# Patient Record
Sex: Male | Born: 1960 | Race: White | Hispanic: No | Marital: Married | Smoking: Former smoker
Health system: Southern US, Community
[De-identification: ages and names within clinical notes are randomized; demographics above are authoritative.]

## PROBLEM LIST (undated history)

## (undated) DIAGNOSIS — G4730 Sleep apnea, unspecified: Secondary | ICD-10-CM

## (undated) DIAGNOSIS — E785 Hyperlipidemia, unspecified: Secondary | ICD-10-CM

## (undated) DIAGNOSIS — I10 Essential (primary) hypertension: Secondary | ICD-10-CM

## (undated) DIAGNOSIS — J45909 Unspecified asthma, uncomplicated: Secondary | ICD-10-CM

## (undated) DIAGNOSIS — F32A Depression, unspecified: Secondary | ICD-10-CM

## (undated) HISTORY — DX: Sleep apnea, unspecified: G47.30

## (undated) HISTORY — PX: HIP FRACTURE SURGERY: SHX118

## (undated) HISTORY — PX: KIDNEY STONE SURGERY: SHX686

## (undated) HISTORY — PX: HIP SURGERY: SHX245

## (undated) HISTORY — DX: Unspecified asthma, uncomplicated: J45.909

## (undated) HISTORY — PX: FOOT SURGERY: SHX648

## (undated) HISTORY — DX: Hyperlipidemia, unspecified: E78.5

## (undated) HISTORY — PX: SHOULDER SURGERY: SHX246

## (undated) HISTORY — PX: APPENDECTOMY: SHX54

---

## 2008-08-21 ENCOUNTER — Ambulatory Visit (HOSPITAL_COMMUNITY): Admission: RE | Admit: 2008-08-21 | Discharge: 2008-08-21 | Payer: Self-pay | Admitting: Cardiology

## 2010-01-12 ENCOUNTER — Encounter
Admission: RE | Admit: 2010-01-12 | Discharge: 2010-01-18 | Payer: Self-pay | Source: Home / Self Care | Admitting: Otolaryngology

## 2014-02-14 ENCOUNTER — Encounter (HOSPITAL_COMMUNITY): Payer: Self-pay | Admitting: *Deleted

## 2014-02-14 ENCOUNTER — Emergency Department (HOSPITAL_COMMUNITY)
Admission: EM | Admit: 2014-02-14 | Discharge: 2014-02-14 | Disposition: A | Discharge status: Home / Self Care | Payer: TRICARE For Life (TFL) | Attending: Emergency Medicine | Admitting: Emergency Medicine

## 2014-02-14 DIAGNOSIS — J029 Acute pharyngitis, unspecified: Secondary | ICD-10-CM | POA: Insufficient documentation

## 2014-02-14 DIAGNOSIS — Z87891 Personal history of nicotine dependence: Secondary | ICD-10-CM | POA: Insufficient documentation

## 2014-02-14 DIAGNOSIS — Z79899 Other long term (current) drug therapy: Secondary | ICD-10-CM | POA: Diagnosis not present

## 2014-02-14 DIAGNOSIS — I10 Essential (primary) hypertension: Secondary | ICD-10-CM | POA: Diagnosis not present

## 2014-02-14 HISTORY — DX: Essential (primary) hypertension: I10

## 2014-02-14 LAB — RAPID STREP SCREEN (MED CTR MEBANE ONLY): Streptococcus, Group A Screen (Direct): NEGATIVE

## 2014-02-14 MED ORDER — DEXAMETHASONE SODIUM PHOSPHATE 10 MG/ML IJ SOLN
10.0000 mg | Freq: Once | INTRAMUSCULAR | Status: AC
  Administered 2014-02-14: 10 mg via INTRAMUSCULAR
  Filled 2014-02-14: qty 1

## 2014-02-14 NOTE — ED Provider Notes (Signed)
CSN: [id number]     Arrival date & time 02/14/14  4235 History  This chart was scribed for non-physician practitioner working with Orpah Greek, by Molli Posey, ED Scribe. This patient was seen in room TR07C/TR07C and the patient's care was started at 10:51 AM.    Chief Complaint  Patient presents with  . Sore Throat  . Fever   The history is provided by the patient. No language interpreter was used.   HPI Comments: Joel Mullins is a 53 y.o. male with a history of HTN who presents to the Emergency Department complaining of a sore throat for the last week. Pt states he is now having a hard time swallowing and that his throat feels more swollen on the right side. He reports he has had an associated fever with a maximum temperature of 102.0. He states he has tried chloraseptic and salt water rinses which have failed to provide relief. Pt reports no known sick contacts. Pt reports no modifying factors at this time.    Past Medical History  Diagnosis Date  . Hypertension    History reviewed. No pertinent past surgical history. No family history on file. History  Substance Use Topics  . Smoking status: Former Research scientist (life sciences)  . Smokeless tobacco: Not on file  . Alcohol Use: No    Review of Systems  Constitutional: Positive for fever.  HENT: Positive for sore throat and trouble swallowing.   All other systems reviewed and are negative.   Allergies  Iohexol  Home Medications   Prior to Admission medications   Medication Sig Start Date End Date Taking? Authorizing Provider  amLODipine (NORVASC) 2.5 MG tablet Take 2.5 mg by mouth daily.   Yes Historical Provider, MD  carvedilol (COREG) 25 MG tablet Take 25 mg by mouth 2 (two) times daily with a meal.   Yes Historical Provider, MD  traMADol (ULTRAM) 50 MG tablet Take 50 mg by mouth every 6 (six) hours as needed (for pain).   Yes Historical Provider, MD   BP 148/114 mmHg  Pulse 77  Temp(Src) 97.9 F (36.6 C) (Oral)  Resp  18  SpO2 100% Physical Exam  Constitutional: He is oriented to person, place, and time. He appears well-developed and well-nourished. No distress.  HENT:  Head: Normocephalic and atraumatic.  Right Ear: External ear normal.  Left Ear: External ear normal.  Mouth/Throat: Posterior oropharyngeal edema and posterior oropharyngeal erythema present. No tonsillar abscesses.  Eyes: Right eye exhibits no discharge. Left eye exhibits no discharge.  Neck: Normal range of motion. Neck supple. No tracheal deviation present.  Cardiovascular: Normal rate.   Pulmonary/Chest: Effort normal. No respiratory distress.  Abdominal: He exhibits no distension.  Neurological: He is alert and oriented to person, place, and time.  Skin: Skin is warm and dry.  Psychiatric: He has a normal mood and affect. His behavior is normal.  Nursing note and vitals reviewed.   ED Course  Procedures  DIAGNOSTIC STUDIES: Oxygen Saturation is 100% on RA, normal by my interpretation.    COORDINATION OF CARE: 10:53 AM Discussed treatment plan with pt at bedside and pt agreed to plan.   Labs Review Labs Reviewed  RAPID STREP SCREEN    Imaging Review No results found.   EKG Interpretation None      MDM   Final diagnoses:  Pharyngitis   Strep is negative here. Pt treated with decadron for comfort. Discussed return and follow up precautions  I personally performed the services described in this documentation,  which was scribed in my presence. The recorded information has been reviewed and is accurate.     Glendell Docker, NP 02/14/14 [place], MD 02/14/14 1314

## 2014-02-14 NOTE — Discharge Instructions (Signed)

## 2014-02-14 NOTE — ED Notes (Signed)
Pt is here with sorethroat for one week and now sore and hard to swallow.  Pt states feels more swollen on right side.  Throat swollen, wakes up coughing and choking on saliva.  Reports temp 102.

## 2014-02-17 LAB — CULTURE, GROUP A STREP

## 2014-02-18 ENCOUNTER — Telehealth: Payer: Self-pay | Admitting: Emergency Medicine

## 2014-02-18 NOTE — Progress Notes (Signed)
ED Antimicrobial Stewardship Positive Culture Follow Up   Denis Carreon is an 53 y.o. male who presented to [facility] on 02/14/2014 with a chief complaint of  Chief Complaint  Patient presents with  . Sore Throat  . Fever    Recent Results (from the past 720 hour(s))  Rapid strep screen   (If patient has fever and/or without cough or runny nose)     Status: None   Collection Time: 02/14/14 10:26 AM  Result Value Ref Range Status   Streptococcus, Group A Screen (Direct) NEGATIVE NEGATIVE Final    Comment: (NOTE) A Rapid Antigen test may result negative if the antigen level in the sample is below the detection level of this test. The FDA has not cleared this test as a stand-alone test therefore the rapid antigen negative result has reflexed to a Group A Strep culture.   Culture, Group A Strep     Status: None   Collection Time: 02/14/14 10:26 AM  Result Value Ref Range Status   Specimen Description THROAT  Final   Special Requests NONE  Final   Culture   Final    GROUP A STREP (S.PYOGENES) ISOLATED Performed at [facility]    Report Status 02/17/2014 FINAL  Final     [x]  Patient discharged originally without antimicrobial agent and treatment is now indicated  New antibiotic prescription: amoxicillin 500mg  po BID x 10 days  ED Provider: Domenic Moras, PA-C   Candie Mile 02/18/2014, 8:53 AM Infectious Diseases Pharmacist Phone# [phone]

## 2016-12-10 ENCOUNTER — Encounter (HOSPITAL_COMMUNITY): Payer: Self-pay | Admitting: Emergency Medicine

## 2016-12-10 ENCOUNTER — Emergency Department (HOSPITAL_COMMUNITY)
Admission: EM | Admit: 2016-12-10 | Discharge: 2016-12-10 | Disposition: A | Discharge status: Home / Self Care | Attending: Emergency Medicine | Admitting: Emergency Medicine

## 2016-12-10 ENCOUNTER — Emergency Department (HOSPITAL_COMMUNITY)

## 2016-12-10 DIAGNOSIS — Z79899 Other long term (current) drug therapy: Secondary | ICD-10-CM | POA: Diagnosis not present

## 2016-12-10 DIAGNOSIS — M25551 Pain in right hip: Secondary | ICD-10-CM | POA: Diagnosis not present

## 2016-12-10 DIAGNOSIS — G8929 Other chronic pain: Secondary | ICD-10-CM | POA: Insufficient documentation

## 2016-12-10 DIAGNOSIS — Z87891 Personal history of nicotine dependence: Secondary | ICD-10-CM | POA: Diagnosis not present

## 2016-12-10 DIAGNOSIS — I10 Essential (primary) hypertension: Secondary | ICD-10-CM | POA: Diagnosis not present

## 2016-12-10 MED ORDER — BACLOFEN 10 MG PO TABS: 10.0000 mg | ORAL_TABLET | Freq: Three times a day (TID) | ORAL | 0 refills | Status: DC

## 2016-12-10 MED ORDER — ONDANSETRON HCL 4 MG/2ML IJ SOLN: 4.0000 mg | Freq: Once | INTRAMUSCULAR | Status: DC

## 2016-12-10 MED ORDER — OXYCODONE-ACETAMINOPHEN 5-325 MG PO TABS
2.0000 | ORAL_TABLET | Freq: Once | ORAL | Status: AC
  Administered 2016-12-10: 2 via ORAL
  Filled 2016-12-10: qty 2

## 2016-12-10 MED ORDER — ONDANSETRON 4 MG PO TBDP
4.0000 mg | ORAL_TABLET | Freq: Once | ORAL | Status: AC
  Administered 2016-12-10: 4 mg via ORAL
  Filled 2016-12-10: qty 1

## 2016-12-10 MED ORDER — BACLOFEN 10 MG PO TABS
10.0000 mg | ORAL_TABLET | Freq: Three times a day (TID) | ORAL | 0 refills | Status: DC
Start: 2016-12-10 — End: 2016-12-10

## 2016-12-10 NOTE — ED Notes (Signed)
ED Provider at bedside. 

## 2016-12-10 NOTE — Discharge Instructions (Signed)
Get help right away if: You fall. You have a sudden increase in pain and swelling in your hip. Your hip is red or swollen or very tender to touch.
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
## 2016-12-10 NOTE — ED Notes (Signed)
Patient transported to X-ray 
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
## 2016-12-23 ENCOUNTER — Emergency Department (HOSPITAL_COMMUNITY)

## 2016-12-23 ENCOUNTER — Observation Stay (HOSPITAL_COMMUNITY)

## 2016-12-23 ENCOUNTER — Observation Stay (HOSPITAL_COMMUNITY)
Admission: EM | Admit: 2016-12-23 | Discharge: 2016-12-24 | Disposition: A | Discharge status: Home / Self Care | Attending: Internal Medicine | Admitting: Internal Medicine

## 2016-12-23 ENCOUNTER — Encounter (HOSPITAL_COMMUNITY): Payer: Self-pay

## 2016-12-23 DIAGNOSIS — R059 Cough, unspecified: Secondary | ICD-10-CM | POA: Diagnosis present

## 2016-12-23 DIAGNOSIS — R0781 Pleurodynia: Secondary | ICD-10-CM | POA: Diagnosis not present

## 2016-12-23 DIAGNOSIS — Z9989 Dependence on other enabling machines and devices: Secondary | ICD-10-CM

## 2016-12-23 DIAGNOSIS — I10 Essential (primary) hypertension: Secondary | ICD-10-CM | POA: Diagnosis not present

## 2016-12-23 DIAGNOSIS — Z23 Encounter for immunization: Secondary | ICD-10-CM | POA: Diagnosis not present

## 2016-12-23 DIAGNOSIS — Z87891 Personal history of nicotine dependence: Secondary | ICD-10-CM

## 2016-12-23 DIAGNOSIS — G4733 Obstructive sleep apnea (adult) (pediatric): Secondary | ICD-10-CM

## 2016-12-23 DIAGNOSIS — R071 Chest pain on breathing: Secondary | ICD-10-CM | POA: Insufficient documentation

## 2016-12-23 DIAGNOSIS — Z79899 Other long term (current) drug therapy: Secondary | ICD-10-CM | POA: Diagnosis not present

## 2016-12-23 DIAGNOSIS — J45901 Unspecified asthma with (acute) exacerbation: Secondary | ICD-10-CM | POA: Diagnosis not present

## 2016-12-23 DIAGNOSIS — R05 Cough: Secondary | ICD-10-CM | POA: Diagnosis present

## 2016-12-23 DIAGNOSIS — R079 Chest pain, unspecified: Secondary | ICD-10-CM | POA: Insufficient documentation

## 2016-12-23 LAB — CBC
HEMATOCRIT: 46.5 % (ref 39.0–52.0)
Hemoglobin: 16.1 g/dL (ref 13.0–17.0)
MCH: 31.5 pg (ref 26.0–34.0)
MCHC: 34.6 g/dL (ref 30.0–36.0)
MCV: 91 fL (ref 78.0–100.0)
PLATELETS: 359 10*3/uL (ref 150–400)
RBC: 5.11 MIL/uL (ref 4.22–5.81)
RDW: 13.8 % (ref 11.5–15.5)
WBC: 10.7 10*3/uL — AB (ref 4.0–10.5)

## 2016-12-23 LAB — I-STAT TROPONIN, ED
TROPONIN I, POC: 0 ng/mL (ref 0.00–0.08)
Troponin i, poc: 0 ng/mL (ref 0.00–0.08)

## 2016-12-23 LAB — BASIC METABOLIC PANEL
Anion gap: 8 (ref 5–15)
BUN: 16 mg/dL (ref 6–20)
CALCIUM: 9.2 mg/dL (ref 8.9–10.3)
CO2: 25 mmol/L (ref 22–32)
CREATININE: 0.94 mg/dL (ref 0.61–1.24)
Chloride: 106 mmol/L (ref 101–111)
GFR calc Af Amer: >60 mL/min (ref >60)
Glucose, Bld: 87 mg/dL (ref 65–99)
POTASSIUM: 4.1 mmol/L (ref 3.5–5.1)
SODIUM: 139 mmol/L (ref 135–145)

## 2016-12-23 LAB — D-DIMER, QUANTITATIVE: D-Dimer, Quant: 0.45 ug/mL-FEU (ref 0.00–0.50)

## 2016-12-23 LAB — PROCALCITONIN: Procalcitonin: <0.1 ng/mL

## 2016-12-23 LAB — I-STAT ARTERIAL BLOOD GAS, ED
ACID-BASE EXCESS: 1 mmol/L (ref 0.0–2.0)
Bicarbonate: 25 mmol/L (ref 20.0–28.0)
O2 SAT: 97 %
TCO2: 26 mmol/L (ref 22–32)
pCO2 arterial: 38.3 mmHg (ref 32.0–48.0)
pH, Arterial: 7.423 (ref 7.350–7.450)
pO2, Arterial: 92 mmHg (ref 83.0–108.0)

## 2016-12-23 LAB — I-STAT CG4 LACTIC ACID, ED
LACTIC ACID, VENOUS: 1.09 mmol/L (ref 0.5–1.9)
Lactic Acid, Venous: 0.76 mmol/L (ref 0.5–1.9)

## 2016-12-23 MED ORDER — AMLODIPINE BESYLATE 2.5 MG PO TABS
2.5000 mg | ORAL_TABLET | Freq: Every day | ORAL | Status: DC
  Administered 2016-12-24: 2.5 mg via ORAL
  Filled 2016-12-23: qty 1

## 2016-12-23 MED ORDER — ONDANSETRON HCL 4 MG/2ML IJ SOLN: 4.0000 mg | Freq: Four times a day (QID) | INTRAMUSCULAR | Status: DC | PRN

## 2016-12-23 MED ORDER — KETOROLAC TROMETHAMINE 30 MG/ML IJ SOLN
30.0000 mg | Freq: Once | INTRAMUSCULAR | Status: AC
  Administered 2016-12-23: 30 mg via INTRAVENOUS
  Filled 2016-12-23: qty 1

## 2016-12-23 MED ORDER — BENZONATATE 100 MG PO CAPS
100.0000 mg | ORAL_CAPSULE | Freq: Once | ORAL | Status: AC
  Administered 2016-12-23: 100 mg via ORAL
  Filled 2016-12-23: qty 1

## 2016-12-23 MED ORDER — ALBUTEROL SULFATE (2.5 MG/3ML) 0.083% IN NEBU
5.0000 mg | INHALATION_SOLUTION | RESPIRATORY_TRACT | Status: DC | PRN
  Administered 2016-12-24: 5 mg via RESPIRATORY_TRACT
  Filled 2016-12-23: qty 6

## 2016-12-23 MED ORDER — IBUPROFEN 800 MG PO TABS
800.0000 mg | ORAL_TABLET | Freq: Three times a day (TID) | ORAL | Status: DC
  Administered 2016-12-24: 800 mg via ORAL
  Filled 2016-12-23: qty 4
  Filled 2016-12-23 (×2): qty 1

## 2016-12-23 MED ORDER — MORPHINE SULFATE (PF) 4 MG/ML IV SOLN
4.0000 mg | Freq: Once | INTRAVENOUS | Status: AC
  Administered 2016-12-23: 4 mg via INTRAVENOUS
  Filled 2016-12-23: qty 1

## 2016-12-23 MED ORDER — BENZONATATE 100 MG PO CAPS
100.0000 mg | ORAL_CAPSULE | Freq: Three times a day (TID) | ORAL | Status: DC | PRN
  Administered 2016-12-24: 100 mg via ORAL
  Filled 2016-12-23: qty 1

## 2016-12-23 MED ORDER — ALBUTEROL SULFATE (2.5 MG/3ML) 0.083% IN NEBU
5.0000 mg | INHALATION_SOLUTION | Freq: Once | RESPIRATORY_TRACT | Status: AC
  Administered 2016-12-23: 5 mg via RESPIRATORY_TRACT
  Filled 2016-12-23: qty 6

## 2016-12-23 MED ORDER — DIPHENHYDRAMINE HCL 25 MG PO TABS
25.0000 mg | ORAL_TABLET | Freq: Four times a day (QID) | ORAL | Status: DC | PRN
  Administered 2016-12-23: 25 mg via ORAL
  Filled 2016-12-23 (×3): qty 1

## 2016-12-23 MED ORDER — ONDANSETRON HCL 4 MG PO TABS
4.0000 mg | ORAL_TABLET | Freq: Four times a day (QID) | ORAL | Status: DC | PRN
Start: 2016-12-23 — End: 2016-12-24

## 2016-12-23 MED ORDER — GUAIFENESIN-DM 100-10 MG/5ML PO SYRP
5.0000 mL | ORAL_SOLUTION | ORAL | Status: DC | PRN
  Administered 2016-12-23 – 2016-12-24 (×2): 5 mL via ORAL
  Filled 2016-12-23 (×2): qty 5

## 2016-12-23 MED ORDER — INFLUENZA VAC SPLIT QUAD 0.5 ML IM SUSY
0.5000 mL | PREFILLED_SYRINGE | INTRAMUSCULAR | Status: AC
  Administered 2016-12-24: 0.5 mL via INTRAMUSCULAR
  Filled 2016-12-23: qty 0.5

## 2016-12-23 MED ORDER — CARVEDILOL 25 MG PO TABS
25.0000 mg | ORAL_TABLET | Freq: Two times a day (BID) | ORAL | Status: DC
  Administered 2016-12-23 – 2016-12-24 (×2): 25 mg via ORAL
  Filled 2016-12-23 (×2): qty 1

## 2016-12-23 MED ORDER — FLUTICASONE PROPIONATE 50 MCG/ACT NA SUSP
2.0000 | Freq: Every day | NASAL | Status: DC
  Administered 2016-12-24: 2 via NASAL
  Filled 2016-12-23: qty 16

## 2016-12-23 MED ORDER — ACETAMINOPHEN 325 MG PO TABS
650.0000 mg | ORAL_TABLET | Freq: Four times a day (QID) | ORAL | Status: DC | PRN
  Administered 2016-12-24: 650 mg via ORAL
  Filled 2016-12-23: qty 2

## 2016-12-23 MED ORDER — ACETAMINOPHEN 650 MG RE SUPP: 650.0000 mg | Freq: Four times a day (QID) | RECTAL | Status: DC | PRN

## 2016-12-23 MED ORDER — ENOXAPARIN SODIUM 40 MG/0.4ML ~~LOC~~ SOLN
40.0000 mg | SUBCUTANEOUS | Status: DC
  Administered 2016-12-23: 40 mg via SUBCUTANEOUS
  Filled 2016-12-23 (×2): qty 0.4

## 2016-12-23 MED ORDER — SODIUM CHLORIDE 0.9% FLUSH
3.0000 mL | Freq: Two times a day (BID) | INTRAVENOUS | Status: DC
  Administered 2016-12-23 – 2016-12-24 (×2): 3 mL via INTRAVENOUS

## 2016-12-23 NOTE — ED Notes (Signed)
Admitting providers at bedside for evaluation.

## 2016-12-23 NOTE — H&P (Signed)
Date: 12/23/2016               Patient Name:  Joel Mullins MRN: [id number]  DOB: 1960-07-04 Age / Sex: 56 y.o., male   PCP: Tery Sanfilippo, MD         Medical Service: Internal Medicine Teaching Service         Attending Physician: Dr. Aldine Contes, MD    First Contact: Dr. Tarri Abernethy Pager: [phone]  Second Contact: Dr. Juleen China Pager: [phone]       After Hours (After 5p/  First Contact Pager: [phone]  weekends / holidays): Second Contact Pager: [phone]   Chief Complaint: chest pain with shortness of breath  History of Present Illness: Joel Mullins is a pleasant 56 year old male who presented with acute onset pleuritic chest pain and mild shortness of breath while walking. He has a past medical history significant for HTN and OSA on CPAP at home intermittently. He stated that he has experienced a viral upper respiratory tract like illness for the past 3 days which he most likely contracted from his grandchildren. He has used sudafed as well as Nyquil with minimal relief. His illness was progressing as any other viral "cold" until this afternoon when he was at work. He stated that he was working as usual as a maintenance manage when he just felt as if he had hit a "brick wall". He simply could not work any longer and felt exhausted. He stated that he called his wife who advised him to take the remainder of hte day off prompting him to depart work. While walking to his car he noted that he felt very short of breath at first, which was quickly followed by lower left sided chest pain that radiated to his neck and back down to his chest. The pain was worse with deep breaths, not relieved by position. The pain is not worse with ambulation until he is forced to take a deep breath. He endorsed a history of previous chest pain with cardiac cath and stress testing eight years prior for similar chest pain. At that time he reportedly had nearly 70% stenosis in an unspecified vessel. (this  information could not be independently confirmed.) The patient also reported that he has experienced a decreased heart rate which was caught on an outpatient cardiac rhythm monitor. He was told his heart would pause, then beat slowly for several beats prior to increasing rapidly for several seconds and finally returning to a normal rhythm. He stated that he most recently experienced a similar episode this past week wherein he feels "sick to his stomach", weak, with feelings of impending doom.   Patient denied nausea, vomiting, diarrhea, constipation, visual changes, fever, chills, abdominal pain, flank pain, hematuria, sputum, nasal drainage, oropharyngitis, swollen lymph nodes, ear pain, or palpitations. Patient attested only to 6/10 headache for three days, and night sweets for 18 years since discharge from the [facility]. He denied sinus pain/drainage, or throat pain.  In the ED, a poorly positioned PCXR was unremarkable except for possible left costovertebral infiltrate vs raised hemidiaphragm. BMP was unremarkable, as was a LA, troponin and CBC except for mild 10.7 WBC. Repeat troponin 4 hours later was also negative. D-dimer was 0.45-WNL's, with an ABG- 7.42/38/92/25.  Patient reportedly was told that he had a contrast dye allergy as he is allergic to shellfish, despite being given contrast dye multiple times in the past.  Meds:  Current Meds  Medication Sig  . albuterol (PROVENTIL HFA;VENTOLIN  HFA) 108 (90 Base) MCG/ACT inhaler Inhale 2 puffs into the lungs as needed.  Marland Kitchen amLODipine (NORVASC) 2.5 MG tablet Take 2.5 mg by mouth daily.  . Azelastine-Fluticasone 137-50 MCG/ACT SUSP Place 1 spray into the nose as needed.  . carvedilol (COREG) 25 MG tablet Take 25 mg by mouth 2 (two) times daily with a meal.  . traMADol (ULTRAM) 50 MG tablet Take 50 mg by mouth every 6 (six) hours as needed (for pain).  . [DISCONTINUED] azelastine (ASTELIN) 0.1 % nasal spray Place 1 spray into both nostrils as  needed.     Allergies: Allergies as of 12/23/2016 - Review Complete 12/23/2016  Allergen Reaction Noted  . Shellfish allergy Nausea And Vomiting and Swelling 12/10/2016   Past Medical History:  Diagnosis Date  . Hypertension     Family History: Denied known family history of significant or treated illnesses in his parents, siblings or children.   Social History:  Social History  Substance Use Topics  . Smoking status: Former Research scientist (life sciences)  . Smokeless tobacco: Never Used  . Alcohol use No  Smoked 2-3 ppd for 12 years. Last known tobacco use was 1993. Has not consumed EtOH since 2001.  Review of Systems: A complete ROS was negative except as per HPI.   Physical Exam: Blood pressure (!) 134/101, pulse 72, temperature 98.2 F (36.8 C), temperature source Oral, resp. rate 17, SpO2 95 %. Physical Exam  Constitutional: He appears well-developed and well-nourished. No distress (Appears uncomfortable).  HENT:  Head: Normocephalic and atraumatic.  Mouth/Throat: Oropharynx is clear and moist. No oropharyngeal exudate.  Eyes: Conjunctivae and EOM are normal.  Neck: Normal range of motion.  Cardiovascular: Normal rate and regular rhythm.   No murmur heard. Pulmonary/Chest: Effort normal. No stridor. No respiratory distress. He has no decreased breath sounds. He has wheezes (Mild late end expiratory ).  Abdominal: Soft. Bowel sounds are normal. He exhibits no distension.  Musculoskeletal: He exhibits tenderness (Posterior left knee, mild point tenderness x2 days). He exhibits no edema.  Neurological: He is alert.  Skin: Skin is warm.  Psychiatric: He has a normal mood and affect.   EKG: personally reviewed my interpretation is NSR.  CXR: personally reviewed my interpretation is mildly blunted diaphragmatic angles more particularly on the left. Otherwise no acute pulmonary process noted.  Assessment & Plan by Problem: Active Problems:   Chest pain on respiration   OSA on CPAP    Essential hypertension   Cough  Joel Mullins is a pleasant 56 year old male who presented with acute onset pleuritic chest pain while walking. Differential diagnosis included, ACS, Pulmonary embolism, pneumonia, viral respiratory infection, costochondritis/Msk pain, pericarditis, and neoplasm (given 24-36 pack year history). Given the lack of ECG or troponin evidence, ACS is unlikely at this time. D-dimer was WNL's, patient is saturating well on room air, BP is elevated, denies SOB, with equal circumference lower distal extremities thus reducing the likelihood of a PE. Patient is afebrile without evidence on CXR of pneumonia with only a slight leukocytosis of 10.7 reducing the likelihood of pneumonia. There is no pain on palpation of the ribs, skin or musculoskeletal system near the area the patient described. Although no clear ECG changes indicating pericarditis as the etiology of the chest pain, PR depression and defuse ST elevation are not seen in all patients experiencing acute Stage 1 pericarditis. (uptodate ~40% may have atypical changes). Given that the pain is slightly improved with sitting up, pleuritic in nature, radiating to his trapezius, exacerbated by deep  inspiration and coughing, pericarditis can not be ruled out at this time.   Chest Pain on deep respiration: At this time the differential includes pericarditis, viral URI with secondary MSK pain from coughing and pneumonia. ACS is unlikely given the current evidence and clinical exam. -Toradol 30mg  once for pain -Did not respond to morphine 4mg  IV (increasing likelihood if an inflammatory based pain) -Ibuprofen 800mg  TID to begin in am -2 view CXR for better evaluation of the left diaphragm  -repeat ECG in the am -Repeat CBC and ordered CMP for am labs -Procalcitonin ordered -Repeat troponin x2 -cough suppression as below -tylenol 650mg  PRN for pain. -GAS culture   HTN: Severe HTN on admission. Patient stated that he typically  records BP readings near 145/85 at home. He often forgets his BP medications which results in similar readings as to what he was experiencing in the ED around 172/101. Patient stated he had taken his BP meds this morning. -Continue home amlodipine 2.5MG  -Continue home carvedilol 25mg   OSA: Patient stated that he was diagnosed with OSA and uses his home CPAP intermittently.  -Refused CPAP during his hospital stay unless he declines clinically -Will observe at this time.  Cough: -Tessalon pearls  -guaifenesin-dextromethorphan 7ml q4 PRN -Albuterol nebs 5mg  q4 PRN   Diet: Regular diet Code: Full DVT ppx: enoxaparin  Fluids: none Dispo: Admit patient to Observation with expected length of stay less than 2 midnights.  Signed: Kathi Ludwig, MD 12/23/2016, 9:12 PM  Pager: Pager# [phone]

## 2016-12-23 NOTE — ED Notes (Signed)
Patient transported to X-ray 

## 2016-12-23 NOTE — ED Notes (Signed)
Attempted to call report at this time.  RN reports "this unit does not accept patients who have active chest pain."

## 2016-12-23 NOTE — Progress Notes (Signed)
Patient admitted to 3E10. Patient is stable and was oriented to the unit. Will continue to monitor.\  Drue Flirt, RN

## 2016-12-23 NOTE — ED Provider Notes (Signed)
[facility] EMERGENCY DEPARTMENT Provider Note   CSN: [id number] Arrival date & time: 12/23/16  1624     History   Chief Complaint Chief Complaint  Patient presents with  . Chest Pain    HPI Joel Mullins is a 56 y.o. male with a history of hypertension, previous "heart blockage" who presents today for evaluation of 2-3 hours of left-sided chest pain, shortness of breath, and diaphoresis that began while he was walking across a parking lot.  He reports that his family has been sick recently with colds and that he has had a nonproductive cough for 2-3 days that gets better when he uses his inhaler.  He also reports he has been having night sweats over the past 2 nights.  He denies any trauma.  He says that this feels like in the past when he had a blockage in his heart.  He says that his pain increases with deep breathing.  He also reports swelling in his left lower leg.    HPI  Past Medical History:  Diagnosis Date  . Hypertension     There are no active problems to display for this patient.   History reviewed. No pertinent surgical history.     Home Medications    Prior to Admission medications   Medication Sig Start Date End Date Taking? Authorizing Provider  amLODipine (NORVASC) 2.5 MG tablet Take 2.5 mg by mouth daily.    [provider]  baclofen (LIORESAL) 10 MG tablet Take 1 tablet (10 mg total) by mouth 3 (three) times daily. 12/10/16   Harris, Vernie Shanks, PA-C  carvedilol (COREG) 25 MG tablet Take 25 mg by mouth 2 (two) times daily with a meal.    [provider]  traMADol (ULTRAM) 50 MG tablet Take 50 mg by mouth every 6 (six) hours as needed (for pain).    [provider]    Family History History reviewed. No pertinent family history.  Social History Social History  Substance Use Topics  . Smoking status: Former Research scientist (life sciences)  . Smokeless tobacco: Never Used  . Alcohol use No     Allergies   Iohexol and  Shellfish allergy   Review of Systems Review of Systems  Constitutional: Positive for diaphoresis. Negative for chills and fever.       Night sweats  HENT: Negative for ear pain and sore throat.   Eyes: Negative for pain and visual disturbance.  Respiratory: Positive for cough, chest tightness and shortness of breath.   Cardiovascular: Positive for chest pain and leg swelling. Negative for palpitations.  Gastrointestinal: Negative for abdominal pain, nausea and vomiting.  Genitourinary: Negative for dysuria and hematuria.  Musculoskeletal: Negative for arthralgias and back pain.  Skin: Negative for color change and rash.  Neurological: Negative for seizures, syncope and headaches.  All other systems reviewed and are negative.    Physical Exam Updated Vital Signs BP (!) 171/101   Pulse 91   Temp 98.2 F (36.8 C) (Oral)   Resp (!) 21   SpO2 96%   Physical Exam  Constitutional: He appears well-developed and well-nourished. No distress.  HENT:  Head: Normocephalic and atraumatic.  Mouth/Throat: Oropharynx is clear and moist.  Eyes: Pupils are equal, round, and reactive to light. Conjunctivae are normal. Right eye exhibits no discharge. Left eye exhibits no discharge. No scleral icterus.  Neck: Normal range of motion. Neck supple. No JVD present.  Cardiovascular: Regular rhythm, S1 normal, S2 normal, normal heart sounds, intact distal pulses and  normal pulses.  Tachycardia present.   No murmur heard. Pulses:      Radial pulses are 2+ on the right side, and 2+ on the left side.       Dorsalis pedis pulses are 2+ on the right side, and 2+ on the left side.       Posterior tibial pulses are 2+ on the right side, and 2+ on the left side.  Pulmonary/Chest: No stridor. He is in respiratory distress (Mild). He has wheezes. He exhibits tenderness.  Palpation over the left lower sternal costal joints both re-created and exacerbates his pain.   Frequent coughing  Abdominal: Soft. Bowel  sounds are normal. He exhibits no distension. There is no tenderness.  Musculoskeletal: He exhibits no edema or deformity.  Left lower leg is questionably larger than right.  TTP over posterior left leg.   Neurological: He is alert. He exhibits normal muscle tone.  Skin: Skin is warm. He is diaphoretic.  Psychiatric: He has a normal mood and affect. His behavior is normal.  Nursing note and vitals reviewed.    ED Treatments / Results  Labs (all labs ordered are listed, but only abnormal results are displayed) Labs Reviewed  CBC - Abnormal; Notable for the following components:      Result Value   WBC 10.7 (*)    All other components within normal limits  COMPREHENSIVE METABOLIC PANEL - Abnormal; Notable for the following components:   Calcium 8.8 (*)    All other components within normal limits  BASIC METABOLIC PANEL  D-DIMER, QUANTITATIVE (NOT AT [facility])  PROCALCITONIN  HIV ANTIBODY (ROUTINE TESTING)  CBC  TROPONIN I  TROPONIN I  I-STAT TROPONIN, ED  I-STAT CG4 LACTIC ACID, ED  I-STAT ARTERIAL BLOOD GAS, ED  I-STAT CG4 LACTIC ACID, ED  I-STAT TROPONIN, ED    EKG  EKG Interpretation  Date/Time:  Saturday December 23 2016 16:58:06 EDT Ventricular Rate:  85 PR Interval:  134 QRS Duration: 100 QT Interval:  364 QTC Calculation: 433 R Axis:   27 Text Interpretation:  Age not entered, assumed to be  56 years old for purpose of ECG interpretation Sinus rhythm Abnormal R-wave progression, early transition Confirmed by Pattricia Boss [id number]) on 12/23/2016 5:09:15 PM       Radiology Dg Chest 1 View  Result Date: 12/23/2016 CLINICAL DATA:  56 year old male with chest pain, shortness of breath and diaphoresis. EXAM: CHEST 1 VIEW COMPARISON:  CT 09/08/2008 FINDINGS: The heart size and mediastinal contours are within normal limits. Note is made of low lung volumes. There is stable elevation of the left hemidiaphragm. Both lungs are otherwise clear. The visualized skeletal  structures are unremarkable. IMPRESSION: Low lung volumes without acute cardiopulmonary pathology. Electronically Signed   By: Kristopher Oppenheim M.D.   On: 12/23/2016 17:13   Dg Chest 2 View  Result Date: 12/24/2016 CLINICAL DATA:  56 year old male with cough today. EXAM: CHEST  2 VIEW COMPARISON:  12/23/2016 FINDINGS: The heart size and mediastinal contours are unchanged. There is mild elevation of the left hemidiaphragm. Both lungs are clear. No pleural effusions or pneumothorax. The visualized skeletal structures are unremarkable. IMPRESSION: No active cardiopulmonary disease. Electronically Signed   By: Kristopher Oppenheim M.D.   On: 12/24/2016 07:47    Procedures Procedures (including critical care time)  Medications Ordered in ED Medications  morphine 4 MG/ML injection 4 mg (4 mg Intravenous Given 12/23/16 1704)  albuterol (PROVENTIL) (2.5 MG/3ML) 0.083% nebulizer solution 5 mg (5 mg Nebulization Given  12/23/16 1803)  benzonatate (TESSALON) capsule 100 mg (100 mg Oral Given 12/23/16 1902)  ketorolac (TORADOL) 30 MG/ML injection 30 mg (30 mg Intravenous Given 12/23/16 2215)           Initial Impression / Assessment and Plan / ED Course  I have reviewed the triage vital signs and the nursing notes.  Pertinent labs & imaging results that were available during my care of the patient were reviewed by me and considered in my medical decision making (see chart for details).  Clinical Course as of Dec 25 100  Sat Dec 23, 2016  1741 Patient re-checked, says pain is 4/10 until he coughs when it gets worse.   [EH]  1904 Spoke with IM teaching service who will come see patient.   [EH]    Clinical Course User Index [EH] Lorin Glass, PA-C   Concern for cardiac etiology of Chest Pain.  Pt does not meet criteria for CP protocol and a further evaluation is recommended.  While I feel that his chest pain is most likely secondary to coughing and musculoskeletal pain, his heart score is 5 and I  feel that he would benefit from trending enzymes.  Initial troponin negative, EKG without obvious ischemic changes.  Chest x-ray does not show any obvious pneumonia.  Do question PE based on sudden onset, pleuritic nature of chest pain associated with SOB.  D-dimer is pending and will be followed up by admitting team who will perform additional workup as needed.  Based on dyspnea arterial blood gas was obtained. He was treated with nebulizer, tessalon, and morphine.  The patient was seen by Dr. Jeanell Sparrow who evaluated the patient and agreed with my plan.    The patient appears reasonably stabilized for admission considering the current resources, flow, and capabilities available in the ED at this time, and I doubt any other [facility] requiring further screening and/or treatment in the ED prior to admission.    Final Clinical Impressions(s) / ED Diagnoses   Final diagnoses:  Cough    New Prescriptions New Prescriptions   No medications on file     Ollen Gross 12/25/16 0111    Pattricia Boss, MD 12/26/16 1051

## 2016-12-23 NOTE — ED Notes (Signed)
Called main lab to add on ddimer. 

## 2016-12-23 NOTE — ED Triage Notes (Signed)
Onset 3 days ago NP cough, pt using inhaler.  Onset 2 hours PTA left sided chest pain, shortness of breath, and diaphoresis.  Pt was walking across parking lot at onset.

## 2016-12-24 ENCOUNTER — Observation Stay (HOSPITAL_COMMUNITY)

## 2016-12-24 ENCOUNTER — Other Ambulatory Visit: Payer: Self-pay

## 2016-12-24 ENCOUNTER — Telehealth: Payer: Self-pay | Admitting: *Deleted

## 2016-12-24 DIAGNOSIS — J45901 Unspecified asthma with (acute) exacerbation: Secondary | ICD-10-CM

## 2016-12-24 DIAGNOSIS — Z91013 Allergy to seafood: Secondary | ICD-10-CM

## 2016-12-24 DIAGNOSIS — I10 Essential (primary) hypertension: Secondary | ICD-10-CM

## 2016-12-24 DIAGNOSIS — Z23 Encounter for immunization: Secondary | ICD-10-CM | POA: Diagnosis not present

## 2016-12-24 DIAGNOSIS — Z9989 Dependence on other enabling machines and devices: Secondary | ICD-10-CM

## 2016-12-24 DIAGNOSIS — G4733 Obstructive sleep apnea (adult) (pediatric): Secondary | ICD-10-CM | POA: Diagnosis not present

## 2016-12-24 DIAGNOSIS — R0781 Pleurodynia: Secondary | ICD-10-CM | POA: Diagnosis not present

## 2016-12-24 LAB — CBC
HEMATOCRIT: 43.4 % (ref 39.0–52.0)
Hemoglobin: 14.5 g/dL (ref 13.0–17.0)
MCH: 30.5 pg (ref 26.0–34.0)
MCHC: 33.4 g/dL (ref 30.0–36.0)
MCV: 91.4 fL (ref 78.0–100.0)
Platelets: 333 10*3/uL (ref 150–400)
RBC: 4.75 MIL/uL (ref 4.22–5.81)
RDW: 14.2 % (ref 11.5–15.5)
WBC: 9 10*3/uL (ref 4.0–10.5)

## 2016-12-24 LAB — COMPREHENSIVE METABOLIC PANEL
ALBUMIN: 3.6 g/dL (ref 3.5–5.0)
ALT: 24 U/L (ref 17–63)
AST: 23 U/L (ref 15–41)
Alkaline Phosphatase: 71 U/L (ref 38–126)
Anion gap: 9 (ref 5–15)
BUN: 16 mg/dL (ref 6–20)
CALCIUM: 8.8 mg/dL — AB (ref 8.9–10.3)
CO2: 24 mmol/L (ref 22–32)
CREATININE: 0.94 mg/dL (ref 0.61–1.24)
Chloride: 104 mmol/L (ref 101–111)
GFR calc non Af Amer: >60 mL/min (ref >60)
Glucose, Bld: 99 mg/dL (ref 65–99)
Potassium: 3.7 mmol/L (ref 3.5–5.1)
SODIUM: 137 mmol/L (ref 135–145)
Total Bilirubin: 0.8 mg/dL (ref 0.3–1.2)
Total Protein: 6.6 g/dL (ref 6.5–8.1)

## 2016-12-24 LAB — TROPONIN I: Troponin I: <0.03 ng/mL (ref <0.03)

## 2016-12-24 LAB — HIV ANTIBODY (ROUTINE TESTING W REFLEX): HIV Screen 4th Generation wRfx: NONREACTIVE

## 2016-12-24 MED ORDER — AZITHROMYCIN 250 MG PO TABS: ORAL_TABLET | ORAL | 0 refills | Status: DC

## 2016-12-24 MED ORDER — ALBUTEROL SULFATE (2.5 MG/3ML) 0.083% IN NEBU
5.0000 mg | INHALATION_SOLUTION | RESPIRATORY_TRACT | Status: DC
  Administered 2016-12-24: 5 mg via RESPIRATORY_TRACT
  Filled 2016-12-24: qty 6

## 2016-12-24 MED ORDER — PROMETHAZINE-CODEINE 6.25-10 MG/5ML PO SOLN: 5.0000 mL | Freq: Three times a day (TID) | ORAL | 0 refills | Status: DC | PRN

## 2016-12-24 MED ORDER — PREDNISONE 20 MG PO TABS
40.0000 mg | ORAL_TABLET | Freq: Every day | ORAL | Status: DC
  Administered 2016-12-24: 40 mg via ORAL
  Filled 2016-12-24 (×2): qty 2

## 2016-12-24 MED ORDER — ORAL CARE MOUTH RINSE
15.0000 mL | Freq: Two times a day (BID) | OROMUCOSAL | Status: DC
  Administered 2016-12-24: 15 mL via OROMUCOSAL

## 2016-12-24 MED ORDER — PREDNISONE 20 MG PO TABS: 40.0000 mg | ORAL_TABLET | Freq: Every day | ORAL | 0 refills | Status: AC

## 2016-12-24 MED ORDER — AZITHROMYCIN 500 MG PO TABS
500.0000 mg | ORAL_TABLET | Freq: Once | ORAL | Status: AC
Start: 2016-12-24 — End: 2016-12-24
  Administered 2016-12-24: 500 mg via ORAL
  Filled 2016-12-24: qty 1

## 2016-12-24 MED ORDER — ALBUTEROL SULFATE (2.5 MG/3ML) 0.083% IN NEBU: 2.5000 mg | INHALATION_SOLUTION | Freq: Four times a day (QID) | RESPIRATORY_TRACT | Status: DC | PRN

## 2016-12-24 MED ORDER — PREDNISONE 20 MG PO TABS: 40.0000 mg | ORAL_TABLET | Freq: Every day | ORAL | Status: DC

## 2016-12-24 MED ORDER — TRAMADOL HCL 50 MG PO TABS
50.0000 mg | ORAL_TABLET | Freq: Once | ORAL | Status: AC
  Administered 2016-12-24: 50 mg via ORAL
  Filled 2016-12-24: qty 1

## 2016-12-24 MED ORDER — ALBUTEROL SULFATE HFA 108 (90 BASE) MCG/ACT IN AERS: 2.0000 | INHALATION_SPRAY | RESPIRATORY_TRACT | 2 refills | Status: DC | PRN

## 2016-12-24 NOTE — Progress Notes (Signed)
Patient had complaints of left thoracic pain when coughing or deep breathing. PRN pain medicine, cough medicine, and breathing treatments were given. Patient stated that" he feels better from when he was in the ED".Left thoracic pain is still present. Vital signs are stable. Will continue to monitor.   Drue Flirt, RN

## 2016-12-24 NOTE — Telephone Encounter (Signed)
Rx changed to include 1 Bottle = 100 ml (s)

## 2016-12-24 NOTE — Progress Notes (Signed)
   Subjective: Initially did well overnight, however, he went down for a chest x-ray this am which aggravated his chest pain and SOB. States he has been coughing and wheezing for approximately 2 weeks. Prior smoking history of 20 pack years but quit almost 20 years ago. He has never had lung function tests. Family members have been sick as well. All questions and concerns addressed.   Objective: Vital signs in last 24 hours: Vitals:   12/23/16 2100 12/23/16 2145 12/23/16 2225 12/24/16 0001  BP: (!) 134/101 (!) 181/100  (!) 146/92  Pulse: 72 66 78 68  Resp: 17 18 16 18   Temp:  97.6 F (36.4 C)  98 F (36.7 C)  TempSrc:  Oral  Oral  SpO2: 95% 97% 98% 98%  Weight:  265 lb 8 oz (120.4 kg)    Height:  5\' 11"  (1.803 m)     General: Obese male in no acute distress Pulm: Good air movement with bilateral expiratory wheezing predominantly on the right  CV: RRR, no murmurs, no rubs  Abdomen: Active bowel sounds, soft, no tenderness to palpation Extremities: Warm and dry, no LE edema   Assessment/Plan:  1. Pleuritic Chest Pain  - DDx includes viral URI with associated MSK pain from coughing vs COPD exacerbation  - ACS ruled out with normal EKG and negative troponin x 3 - Group A Strep negative - Procalcitonin negative  - Repeat EKG and CXR this AM unremarkable  - Will start on Azithromycin and Prednisone    2. Hypertension  - Continue home Amlodipine and Carvedilol  3. Obstructive Sleep Apnea  - Not wanting the CPAP, continue to monitor   4. Cough  - Tessalon pearls and albuterol PRN - Discontinue guaifenesin-dextromethorphan as this will elevate his BP  Dispo: Anticipated discharge in approximately 0 day(s).   Ina Homes, MD 12/24/2016, 5:23 AM My Pager: [phone]

## 2016-12-24 NOTE — Discharge Summary (Signed)
Name: Joel Mullins MRN: [id number] DOB: 1960-10-24 56 y.o. PCP: Tery Sanfilippo, MD  Date of Admission: 12/23/2016  4:33 PM Date of Discharge: 12/25/2016 Attending Physician: No att. providers found  Discharge Diagnosis: 1. Asthma Exacerbation  Principal Problem:   Asthma with acute exacerbation Active Problems:   Chest pain on respiration   OSA on CPAP   Essential hypertension   Cough  Discharge Medications: Allergies as of 12/24/2016      Reactions   Shellfish Allergy Nausea And Vomiting, Swelling      Medication List    TAKE these medications   albuterol 108 (90 Base) MCG/ACT inhaler Commonly known as:  PROVENTIL HFA;VENTOLIN HFA Inhale 2 puffs as needed into the lungs.   amLODipine 2.5 MG tablet Commonly known as:  NORVASC Take 2.5 mg by mouth daily.   Azelastine-Fluticasone 137-50 MCG/ACT Susp Place 1 spray into the nose as needed.   azithromycin 250 MG tablet Commonly known as:  ZITHROMAX Take 1 tablet daily   baclofen 10 MG tablet Commonly known as:  LIORESAL Take 1 tablet (10 mg total) by mouth 3 (three) times daily.   carvedilol 25 MG tablet Commonly known as:  COREG Take 25 mg by mouth 2 (two) times daily with a meal.   predniSONE 20 MG tablet Commonly known as:  DELTASONE Take 2 tablets (40 mg total) daily with breakfast for 4 days by mouth.   Promethazine-Codeine 6.25-10 MG/5ML Soln Take 5 mLs 3 (three) times daily as needed by mouth (for cough).   traMADol 50 MG tablet Commonly known as:  ULTRAM Take 50 mg by mouth every 6 (six) hours as needed (for pain).      Disposition and follow-up:   [name] was discharged from [facility] in Stable condition.  At the hospital follow up visit please address:  1.  Asthma. Consider getting repeat PFTs  2.  Labs / imaging needed at time of follow-up: None  3.  Pending labs/ test needing follow-up: None  Hospital Course by problem list: Principal Problem:  Asthma with acute exacerbation Active Problems:   Chest pain on respiration   OSA on CPAP   Essential hypertension   Cough   1. Asthma Exacerbation. [name] is a 56 y.o. Male with a PMHx significant for Asthma (diagnosed in 2015 with PFTs), OSA, and HTN who presented to the ED with acute onset pleuritic chest pain and dyspnea on exertion. Ischemic evaluation, CXR, CBC, BMP, ABG, and D-dimer were all unremarkable. After discussing the clinical coarse with the patient it was determined that this was most likely an Asthma exacerbation. He was discharged in stable condition. He had oxygen saturations >95%, no hypercapnia on ABG, was able to ambulate, and tolerate PO intake. He was given a prescription for Prednisone, Azithromycin, Albuterol, and Cough syrup with codeine. All questions and concerns were addressed prior to discharge. Per chart review he was prescribed montelukast and albuterol for asthma maintenance; however, he is not taking the montelukast. We recommend follow-up with his pulmonologist and repeat PFTs once the acute exacerbation symptoms have resolved. He was given a 5 day course of prednisone and depending on his symptoms may require a longer duration.   Discharge Vitals:   BP 134/86 (BP Location: Right Arm)   Pulse (!) 58   Temp 97.8 F (36.6 C) (Oral)   Resp 18   Ht 5\' 11"  (1.803 m)   Wt 264 lb 1.6 oz (119.8 kg) Comment: scale a  SpO2 95%  BMI 36.83 kg/m   Pertinent Labs, Studies, and Procedures:   2 View Chest X-ray IMPRESSION: No active cardiopulmonary disease.  Discharge Instructions: Discharge Instructions    Call MD for:  difficulty breathing, headache or visual disturbances   Complete by:  As directed    Call MD for:  severe uncontrolled pain   Complete by:  As directed    Diet - low sodium heart healthy   Complete by:  As directed    Increase activity slowly   Complete by:  As directed      Signed: Ina Homes, MD 12/25/2016, 6:38 AM   My  Pager: [phone]

## 2016-12-24 NOTE — Discharge Instructions (Signed)
Thank you for allowing [place] to provide your care.   - We have sent out a prescription for a 4 day course of Prednisone and Azithromycin.  - We have sent out a prescription for an albuterol inhaler and cough syrup.    - Please follow-up with your primary care doctor as soon as possible. You need pulmonary function testing once you have recovered from this acute illness.  - If your chest pain gets worse please seek medical attention.

## 2016-12-24 NOTE — Progress Notes (Signed)
Patient is alert and verbal, states he feel better since he took the prednisone and breathing treatment. Per Dr. order, pt is been discharged home, IV and tele d/c, discharge teaching and summary review by this writer with patient, and pt demonstrated understanding.

## 2016-12-25 DIAGNOSIS — J45901 Unspecified asthma with (acute) exacerbation: Secondary | ICD-10-CM

## 2017-02-03 ENCOUNTER — Emergency Department (HOSPITAL_COMMUNITY)

## 2017-02-03 ENCOUNTER — Encounter (HOSPITAL_COMMUNITY): Payer: Self-pay

## 2017-02-03 ENCOUNTER — Other Ambulatory Visit: Payer: Self-pay

## 2017-02-03 ENCOUNTER — Emergency Department (HOSPITAL_COMMUNITY)
Admission: EM | Admit: 2017-02-03 | Discharge: 2017-02-03 | Disposition: A | Discharge status: Home / Self Care | Attending: Emergency Medicine | Admitting: Emergency Medicine

## 2017-02-03 DIAGNOSIS — J45909 Unspecified asthma, uncomplicated: Secondary | ICD-10-CM | POA: Diagnosis not present

## 2017-02-03 DIAGNOSIS — Z79899 Other long term (current) drug therapy: Secondary | ICD-10-CM | POA: Diagnosis not present

## 2017-02-03 DIAGNOSIS — I10 Essential (primary) hypertension: Secondary | ICD-10-CM | POA: Diagnosis not present

## 2017-02-03 DIAGNOSIS — M79671 Pain in right foot: Secondary | ICD-10-CM | POA: Insufficient documentation

## 2017-02-03 DIAGNOSIS — X509XXD Other and unspecified overexertion or strenuous movements or postures, subsequent encounter: Secondary | ICD-10-CM | POA: Diagnosis not present

## 2017-02-03 DIAGNOSIS — S8991XD Unspecified injury of right lower leg, subsequent encounter: Secondary | ICD-10-CM | POA: Diagnosis present

## 2017-02-03 DIAGNOSIS — S83401D Sprain of unspecified collateral ligament of right knee, subsequent encounter: Secondary | ICD-10-CM | POA: Diagnosis not present

## 2017-02-03 MED ORDER — IBUPROFEN 800 MG PO TABS
800.0000 mg | ORAL_TABLET | Freq: Once | ORAL | Status: AC
  Administered 2017-02-03: 800 mg via ORAL
  Filled 2017-02-03: qty 1

## 2017-02-03 MED ORDER — IBUPROFEN 800 MG PO TABS
800.0000 mg | ORAL_TABLET | Freq: Three times a day (TID) | ORAL | 0 refills | Status: DC
Start: 2017-02-03 — End: 2017-07-05

## 2017-02-03 NOTE — ED Notes (Signed)
Patient transported to X-ray 

## 2017-02-03 NOTE — ED Provider Notes (Signed)
[facility] EMERGENCY DEPARTMENT Provider Note   CSN: [id number] Arrival date & time: 02/03/17  1528     History   Chief Complaint Chief Complaint  Patient presents with  . Knee Injury    HPI Joel Mullins is a 56 y.o. male.  HPI   56 year old male presenting for evaluation of right knee and right foot injury.  Patient report 3 days ago he was at work, as he was walking he was trying to avoid a patch of ice by stepping over it.  He however injured his right knee and right foot after stepping over the patch of ice.  States that he twisted his knee inward and felt a pop.  He developed severe sharp pain primary to the lateral aspect of his knee since the injury worse with standing or weightbearing.  Pain is nonradiating, moderate to severe, not adequately improved with rest, and elevation.  He is using a set of crutches to help walk around.  He was seen at primary care after the injury.  States that he had an x-ray of his right knee that was negative.  He is here primarily to get further evaluation of his right knee injury.  He also complaining of pain to his right foot from the injury.  States it hurts when he stands on it.  Pain is sharp, to the back of his heel, nonradiating, moderate in severity, improves with rest.  No associated fever, numbness, or rash.  Denies any hip pain or back pain.  Past Medical History:  Diagnosis Date  . Hypertension     Patient Active Problem List   Diagnosis Date Noted  . Asthma with acute exacerbation 12/25/2016  . Chest pain on respiration 12/23/2016  . Chest pain 12/23/2016  . OSA on CPAP 12/23/2016  . Essential hypertension 12/23/2016  . Cough     History reviewed. No pertinent surgical history.     Home Medications    Prior to Admission medications   Medication Sig Start Date End Date Taking? Authorizing Provider  albuterol (PROVENTIL HFA;VENTOLIN HFA) 108 (90 Base) MCG/ACT inhaler Inhale 2 puffs as needed into  the lungs. 12/24/16 12/24/17  Ina Homes, MD  amLODipine (NORVASC) 2.5 MG tablet Take 2.5 mg by mouth daily.    [provider]  Azelastine-Fluticasone 137-50 MCG/ACT SUSP Place 1 spray into the nose as needed. 12/04/13   [provider]  azithromycin (ZITHROMAX) 250 MG tablet Take 1 tablet daily 12/24/16   Ina Homes, MD  baclofen (LIORESAL) 10 MG tablet Take 1 tablet (10 mg total) by mouth 3 (three) times daily. Patient not taking: Reported on 12/23/2016 12/10/16   Margarita Mail, PA-C  carvedilol (COREG) 25 MG tablet Take 25 mg by mouth 2 (two) times daily with a meal.    [provider]  Promethazine-Codeine 6.25-10 MG/5ML SOLN Take 5 mLs 3 (three) times daily as needed by mouth (for cough). 12/24/16   Ina Homes, MD  traMADol (ULTRAM) 50 MG tablet Take 50 mg by mouth every 6 (six) hours as needed (for pain).    [provider]    Family History No family history on file.  Social History Social History   Tobacco Use  . Smoking status: Former Research scientist (life sciences)  . Smokeless tobacco: Never Used  Substance Use Topics  . Alcohol use: No  . Drug use: No     Allergies   Shellfish allergy   Review of Systems Review of Systems  Constitutional: Negative for fever.  Musculoskeletal: Positive for arthralgias.  Skin: Negative for rash and wound.  Neurological: Negative for numbness.     Physical Exam Updated Vital Signs BP (!) 143/102   Pulse 84   Temp 98.4 F (36.9 C) (Oral)   Resp 18   SpO2 96%   Physical Exam  Constitutional: He appears well-developed and well-nourished. No distress.  HENT:  Head: Atraumatic.  Eyes: Conjunctivae are normal.  Neck: Neck supple.  Musculoskeletal: He exhibits tenderness (Right knee: Tenderness noted to lateral joint line on palpation with very faint swelling noted.  But decreasing flexion and extension of the knee secondary to pain.  Patella is dislocated, no crepitus or deformity.  No joint laxity.).    Right foot: Tenderness to the sole foot at the heel on palpation without any crepitus swelling, or warmth.  No deformity noted.  Dorsalis pedis pulse palpable.  Right ankle with full range of motion.  Neurological: He is alert.  Skin: No rash noted.  Psychiatric: He has a normal mood and affect.  Nursing note and vitals reviewed.    ED Treatments / Results  Labs (all labs ordered are listed, but only abnormal results are displayed) Labs Reviewed - No data to display  EKG  EKG Interpretation None       Radiology Dg Foot Complete Right  Result Date: 02/03/2017 CLINICAL DATA:  56 year old male with right foot pain with weight-bearing. Limited range of motion. EXAM: RIGHT FOOT COMPLETE - 3+ VIEW COMPARISON:  None. FINDINGS: Osteoarthritic joint space narrowing and minimal spurring is seen of the first MTP articulation. No acute fracture nor joint dislocation is noted. A bone island is noted of the tuft of the great toe. The midfoot articulations, tibiotalar and subtalar joints are intact. No soft tissue mass or mineralization. Tiny plantar calcaneal enthesophyte is seen. IMPRESSION: First MTP joint osteoarthritis. Tiny plantar calcaneal enthesophyte. Electronically Signed   By: Ashley Royalty M.D.   On: 02/03/2017 17:45    Procedures Procedures (including critical care time)  Medications Ordered in ED Medications  ibuprofen (ADVIL,MOTRIN) tablet 800 mg (800 mg Oral Given 02/03/17 1725)     Initial Impression / Assessment and Plan / ED Course  I have reviewed the triage vital signs and the nursing notes.  Pertinent labs & imaging results that were available during my care of the patient were reviewed by me and considered in my medical decision making (see chart for details).     BP (!) 143/87 (BP Location: Left Arm)   Pulse 85   Temp 98.5 F (36.9 C) (Oral)   Resp 18   SpO2 96%    Final Clinical Impressions(s) / ED Diagnoses   Final diagnoses:  Sprain of collateral  ligament of right knee, subsequent encounter  Right foot pain    ED Discharge Orders        Ordered    ibuprofen (ADVIL,MOTRIN) 800 MG tablet  3 times daily     02/03/17 1753     5:17 PM Patient had a mechanical injury 3 days ago and injured his right knee and right foot.  He has had a negative x-ray of his right knee but still has pain.  Suspect meniscal injury based on location.  Will give referral to orthopedics for further evaluation.  He also complaining of pain to the sole of his right foot.  Request for an x-ray, x-ray ordered.  Ibuprofen given.  No evidence to suggest infection.  He is neurovascular intact.  5:57 PM X-ray of right  foot show no acute fractures or dislocation.  Osteoarthritis noted in the first MTP joint.   Domenic Moras, PA-C 02/03/17 1800    Virgel Manifold, MD 02/06/17 [phone]

## 2017-02-03 NOTE — ED Triage Notes (Signed)
Patient slipped and twisted right knee this past Thursday. Seen in [place] with negative xray but concerned with increased pain. Arrived using crutches

## 2017-05-29 ENCOUNTER — Other Ambulatory Visit: Payer: Self-pay | Admitting: Family Medicine

## 2017-05-29 DIAGNOSIS — K769 Liver disease, unspecified: Secondary | ICD-10-CM

## 2017-05-29 DIAGNOSIS — R0602 Shortness of breath: Secondary | ICD-10-CM

## 2017-05-29 DIAGNOSIS — J918 Pleural effusion in other conditions classified elsewhere: Secondary | ICD-10-CM

## 2017-06-06 ENCOUNTER — Ambulatory Visit
Admission: RE | Admit: 2017-06-06 | Discharge: 2017-06-06 | Disposition: A | Discharge status: Home / Self Care | Source: Ambulatory Visit | Attending: Family Medicine | Admitting: Family Medicine

## 2017-06-06 DIAGNOSIS — R0602 Shortness of breath: Secondary | ICD-10-CM

## 2017-06-06 DIAGNOSIS — K769 Liver disease, unspecified: Principal | ICD-10-CM

## 2017-06-06 DIAGNOSIS — J918 Pleural effusion in other conditions classified elsewhere: Secondary | ICD-10-CM

## 2017-06-25 ENCOUNTER — Other Ambulatory Visit: Payer: Self-pay | Admitting: Family Medicine

## 2017-06-25 ENCOUNTER — Ambulatory Visit
Admission: RE | Admit: 2017-06-25 | Discharge: 2017-06-25 | Disposition: A | Discharge status: Home / Self Care | Source: Ambulatory Visit | Attending: Family Medicine | Admitting: Family Medicine

## 2017-06-25 DIAGNOSIS — R05 Cough: Secondary | ICD-10-CM

## 2017-06-25 DIAGNOSIS — R058 Other specified cough: Secondary | ICD-10-CM

## 2017-07-01 ENCOUNTER — Inpatient Hospital Stay (HOSPITAL_COMMUNITY)
Admission: EM | Admit: 2017-07-01 | Discharge: 2017-07-05 | DRG: 202 | Disposition: A | Discharge status: Home / Self Care | Attending: Internal Medicine | Admitting: Internal Medicine

## 2017-07-01 ENCOUNTER — Encounter (HOSPITAL_COMMUNITY): Payer: Self-pay

## 2017-07-01 ENCOUNTER — Emergency Department (HOSPITAL_COMMUNITY)

## 2017-07-01 ENCOUNTER — Other Ambulatory Visit: Payer: Self-pay

## 2017-07-01 DIAGNOSIS — Z87891 Personal history of nicotine dependence: Secondary | ICD-10-CM | POA: Diagnosis not present

## 2017-07-01 DIAGNOSIS — Z7951 Long term (current) use of inhaled steroids: Secondary | ICD-10-CM | POA: Diagnosis not present

## 2017-07-01 DIAGNOSIS — J9601 Acute respiratory failure with hypoxia: Secondary | ICD-10-CM | POA: Diagnosis present

## 2017-07-01 DIAGNOSIS — R0600 Dyspnea, unspecified: Secondary | ICD-10-CM | POA: Diagnosis not present

## 2017-07-01 DIAGNOSIS — J9621 Acute and chronic respiratory failure with hypoxia: Secondary | ICD-10-CM | POA: Diagnosis not present

## 2017-07-01 DIAGNOSIS — N289 Disorder of kidney and ureter, unspecified: Secondary | ICD-10-CM

## 2017-07-01 DIAGNOSIS — Z91013 Allergy to seafood: Secondary | ICD-10-CM

## 2017-07-01 DIAGNOSIS — Z79899 Other long term (current) drug therapy: Secondary | ICD-10-CM | POA: Diagnosis not present

## 2017-07-01 DIAGNOSIS — J4551 Severe persistent asthma with (acute) exacerbation: Principal | ICD-10-CM | POA: Diagnosis present

## 2017-07-01 DIAGNOSIS — Z9989 Dependence on other enabling machines and devices: Secondary | ICD-10-CM

## 2017-07-01 DIAGNOSIS — G4733 Obstructive sleep apnea (adult) (pediatric): Secondary | ICD-10-CM | POA: Diagnosis present

## 2017-07-01 DIAGNOSIS — J45901 Unspecified asthma with (acute) exacerbation: Secondary | ICD-10-CM | POA: Diagnosis present

## 2017-07-01 DIAGNOSIS — D72829 Elevated white blood cell count, unspecified: Secondary | ICD-10-CM | POA: Diagnosis present

## 2017-07-01 DIAGNOSIS — N179 Acute kidney failure, unspecified: Secondary | ICD-10-CM | POA: Diagnosis present

## 2017-07-01 DIAGNOSIS — R079 Chest pain, unspecified: Secondary | ICD-10-CM | POA: Diagnosis not present

## 2017-07-01 DIAGNOSIS — I10 Essential (primary) hypertension: Secondary | ICD-10-CM | POA: Diagnosis present

## 2017-07-01 DIAGNOSIS — Z791 Long term (current) use of non-steroidal anti-inflammatories (NSAID): Secondary | ICD-10-CM

## 2017-07-01 DIAGNOSIS — R0602 Shortness of breath: Secondary | ICD-10-CM | POA: Diagnosis present

## 2017-07-01 DIAGNOSIS — D473 Essential (hemorrhagic) thrombocythemia: Secondary | ICD-10-CM | POA: Diagnosis not present

## 2017-07-01 DIAGNOSIS — T782XXA Anaphylactic shock, unspecified, initial encounter: Secondary | ICD-10-CM

## 2017-07-01 LAB — I-STAT TROPONIN, ED: Troponin i, poc: 0.01 ng/mL (ref 0.00–0.08)

## 2017-07-01 LAB — COMPREHENSIVE METABOLIC PANEL
ALT: 27 U/L (ref 17–63)
AST: 26 U/L (ref 15–41)
Albumin: 3.7 g/dL (ref 3.5–5.0)
Alkaline Phosphatase: 88 U/L (ref 38–126)
Anion gap: 10 (ref 5–15)
BILIRUBIN TOTAL: 0.7 mg/dL (ref 0.3–1.2)
BUN: 24 mg/dL — ABNORMAL HIGH (ref 6–20)
CALCIUM: 9.1 mg/dL (ref 8.9–10.3)
CHLORIDE: 104 mmol/L (ref 101–111)
CO2: 26 mmol/L (ref 22–32)
CREATININE: 1.39 mg/dL — AB (ref 0.61–1.24)
GFR, EST NON AFRICAN AMERICAN: 55 mL/min — AB (ref >60)
Glucose, Bld: 110 mg/dL — ABNORMAL HIGH (ref 65–99)
Potassium: 4 mmol/L (ref 3.5–5.1)
Sodium: 140 mmol/L (ref 135–145)
TOTAL PROTEIN: 7.1 g/dL (ref 6.5–8.1)

## 2017-07-01 LAB — CBC
HEMATOCRIT: 46.8 % (ref 39.0–52.0)
HEMOGLOBIN: 15.9 g/dL (ref 13.0–17.0)
MCH: 32.3 pg (ref 26.0–34.0)
MCHC: 34 g/dL (ref 30.0–36.0)
MCV: 94.9 fL (ref 78.0–100.0)
Platelets: 447 10*3/uL — ABNORMAL HIGH (ref 150–400)
RBC: 4.93 MIL/uL (ref 4.22–5.81)
RDW: 14.8 % (ref 11.5–15.5)
WBC: 18.8 10*3/uL — ABNORMAL HIGH (ref 4.0–10.5)

## 2017-07-01 LAB — I-STAT CG4 LACTIC ACID, ED: LACTIC ACID, VENOUS: 3.49 mmol/L — AB (ref 0.5–1.9)

## 2017-07-01 MED ORDER — FAMOTIDINE IN NACL 20-0.9 MG/50ML-% IV SOLN
20.0000 mg | Freq: Two times a day (BID) | INTRAVENOUS | Status: DC
  Administered 2017-07-01: 20 mg via INTRAVENOUS
  Filled 2017-07-01: qty 50

## 2017-07-01 MED ORDER — METHYLPREDNISOLONE SODIUM SUCC 125 MG IJ SOLR
60.0000 mg | Freq: Four times a day (QID) | INTRAMUSCULAR | Status: DC
  Administered 2017-07-02 – 2017-07-03 (×7): 60 mg via INTRAVENOUS
  Filled 2017-07-01 (×7): qty 2

## 2017-07-01 MED ORDER — METHYLPREDNISOLONE SODIUM SUCC 125 MG IJ SOLR
125.0000 mg | Freq: Once | INTRAMUSCULAR | Status: AC
Start: 2017-07-01 — End: 2017-07-01
  Administered 2017-07-01: 125 mg via INTRAVENOUS
  Filled 2017-07-01: qty 2

## 2017-07-01 MED ORDER — SODIUM CHLORIDE 0.9% FLUSH: 3.0000 mL | Freq: Two times a day (BID) | INTRAVENOUS | Status: DC

## 2017-07-01 MED ORDER — BISACODYL 5 MG PO TBEC: 5.0000 mg | DELAYED_RELEASE_TABLET | Freq: Every day | ORAL | Status: DC | PRN

## 2017-07-01 MED ORDER — CARVEDILOL 25 MG PO TABS
25.0000 mg | ORAL_TABLET | Freq: Two times a day (BID) | ORAL | Status: DC
  Administered 2017-07-02 – 2017-07-05 (×7): 25 mg via ORAL
  Filled 2017-07-01 (×7): qty 1

## 2017-07-01 MED ORDER — ACETAMINOPHEN 650 MG RE SUPP: 650.0000 mg | Freq: Four times a day (QID) | RECTAL | Status: DC | PRN

## 2017-07-01 MED ORDER — SODIUM CHLORIDE 0.9 % IV SOLN: 250.0000 mL | INTRAVENOUS | Status: DC | PRN

## 2017-07-01 MED ORDER — MOMETASONE FURO-FORMOTEROL FUM 100-5 MCG/ACT IN AERO
2.0000 | INHALATION_SPRAY | Freq: Two times a day (BID) | RESPIRATORY_TRACT | Status: DC
  Administered 2017-07-02 – 2017-07-04 (×5): 2 via RESPIRATORY_TRACT
  Filled 2017-07-01: qty 8.8

## 2017-07-01 MED ORDER — ONDANSETRON HCL 4 MG/2ML IJ SOLN
4.0000 mg | Freq: Four times a day (QID) | INTRAMUSCULAR | Status: DC | PRN
  Administered 2017-07-02 – 2017-07-03 (×2): 4 mg via INTRAVENOUS
  Filled 2017-07-01 (×3): qty 2

## 2017-07-01 MED ORDER — AMLODIPINE BESYLATE 5 MG PO TABS
2.5000 mg | ORAL_TABLET | Freq: Every day | ORAL | Status: DC
  Administered 2017-07-02 – 2017-07-05 (×4): 2.5 mg via ORAL
  Filled 2017-07-01 (×4): qty 1

## 2017-07-01 MED ORDER — EPINEPHRINE PF 1 MG/10ML IJ SOSY
0.3000 mg | PREFILLED_SYRINGE | Freq: Once | INTRAMUSCULAR | Status: DC
  Filled 2017-07-01: qty 10

## 2017-07-01 MED ORDER — IPRATROPIUM BROMIDE 0.02 % IN SOLN
0.5000 mg | Freq: Once | RESPIRATORY_TRACT | Status: AC
  Administered 2017-07-01: 0.5 mg via RESPIRATORY_TRACT
  Filled 2017-07-01: qty 2.5

## 2017-07-01 MED ORDER — TELMISARTAN-HCTZ 40-12.5 MG PO TABS: 1.0000 | ORAL_TABLET | Freq: Every day | ORAL | Status: DC

## 2017-07-01 MED ORDER — AZITHROMYCIN 500 MG IV SOLR
500.0000 mg | Freq: Once | INTRAVENOUS | Status: AC
  Administered 2017-07-01: 500 mg via INTRAVENOUS
  Filled 2017-07-01: qty 500

## 2017-07-01 MED ORDER — ACETAMINOPHEN 325 MG PO TABS
650.0000 mg | ORAL_TABLET | Freq: Four times a day (QID) | ORAL | Status: DC | PRN
  Administered 2017-07-03 (×2): 650 mg via ORAL
  Filled 2017-07-01 (×2): qty 2

## 2017-07-01 MED ORDER — GUAIFENESIN-CODEINE 100-10 MG/5ML PO SOLN
5.0000 mL | ORAL | Status: DC | PRN
  Administered 2017-07-02 – 2017-07-04 (×4): 5 mL via ORAL
  Filled 2017-07-01 (×4): qty 5

## 2017-07-01 MED ORDER — ONDANSETRON HCL 4 MG PO TABS
4.0000 mg | ORAL_TABLET | Freq: Four times a day (QID) | ORAL | Status: DC | PRN
  Administered 2017-07-03: 4 mg via ORAL
  Filled 2017-07-01: qty 1

## 2017-07-01 MED ORDER — DICLOFENAC SODIUM 1 % TD GEL
1.0000 "application " | Freq: Three times a day (TID) | TRANSDERMAL | Status: DC
  Administered 2017-07-02 – 2017-07-04 (×7): 1 via TOPICAL
  Filled 2017-07-01 (×2): qty 100

## 2017-07-01 MED ORDER — SODIUM CHLORIDE 0.9 % IV SOLN
1.0000 g | Freq: Once | INTRAVENOUS | Status: AC
  Administered 2017-07-01: 1 g via INTRAVENOUS
  Filled 2017-07-01: qty 10

## 2017-07-01 MED ORDER — DIAZEPAM 5 MG/ML IJ SOLN
5.0000 mg | Freq: Once | INTRAMUSCULAR | Status: AC
  Administered 2017-07-01: 5 mg via INTRAVENOUS
  Filled 2017-07-01: qty 2

## 2017-07-01 MED ORDER — EPINEPHRINE 0.3 MG/0.3ML IJ SOAJ
0.3000 mg | Freq: Once | INTRAMUSCULAR | Status: AC
  Administered 2017-07-01: 0.3 mg via INTRAMUSCULAR
  Filled 2017-07-01: qty 0.3

## 2017-07-01 MED ORDER — ENOXAPARIN SODIUM 40 MG/0.4ML ~~LOC~~ SOLN
40.0000 mg | Freq: Every day | SUBCUTANEOUS | Status: DC
  Administered 2017-07-02 – 2017-07-04 (×3): 40 mg via SUBCUTANEOUS
  Filled 2017-07-01 (×3): qty 0.4

## 2017-07-01 MED ORDER — ONDANSETRON HCL 4 MG/2ML IJ SOLN
INTRAMUSCULAR | Status: AC
  Administered 2017-07-01: 4 mg
  Filled 2017-07-01: qty 2

## 2017-07-01 MED ORDER — SENNOSIDES-DOCUSATE SODIUM 8.6-50 MG PO TABS: 1.0000 | ORAL_TABLET | Freq: Every evening | ORAL | Status: DC | PRN

## 2017-07-01 MED ORDER — ALBUTEROL SULFATE (2.5 MG/3ML) 0.083% IN NEBU
5.0000 mg | INHALATION_SOLUTION | Freq: Once | RESPIRATORY_TRACT | Status: AC
  Administered 2017-07-01: 5 mg via RESPIRATORY_TRACT
  Filled 2017-07-01: qty 6

## 2017-07-01 MED ORDER — MAGNESIUM SULFATE 2 GM/50ML IV SOLN
2.0000 g | Freq: Once | INTRAVENOUS | Status: AC
  Administered 2017-07-01: 2 g via INTRAVENOUS
  Filled 2017-07-01: qty 50

## 2017-07-01 MED ORDER — HYDROCODONE-ACETAMINOPHEN 7.5-325 MG/15ML PO SOLN
10.0000 mL | Freq: Once | ORAL | Status: AC
  Administered 2017-07-01: 10 mL via ORAL
  Filled 2017-07-01: qty 15

## 2017-07-01 MED ORDER — METHYLPREDNISOLONE SODIUM SUCC 125 MG IJ SOLR: 60.0000 mg | Freq: Four times a day (QID) | INTRAMUSCULAR | Status: DC

## 2017-07-01 MED ORDER — PROMETHAZINE HCL 25 MG/ML IJ SOLN
25.0000 mg | Freq: Once | INTRAMUSCULAR | Status: AC
  Administered 2017-07-01: 25 mg via INTRAVENOUS
  Filled 2017-07-01: qty 1

## 2017-07-01 MED ORDER — ALBUTEROL SULFATE (2.5 MG/3ML) 0.083% IN NEBU
10.0000 mg | INHALATION_SOLUTION | Freq: Once | RESPIRATORY_TRACT | Status: AC
  Administered 2017-07-01: 10 mg via RESPIRATORY_TRACT
  Filled 2017-07-01: qty 12

## 2017-07-01 MED ORDER — TRAMADOL HCL 50 MG PO TABS
50.0000 mg | ORAL_TABLET | Freq: Four times a day (QID) | ORAL | Status: DC | PRN
  Administered 2017-07-02: 50 mg via ORAL
  Filled 2017-07-01: qty 1

## 2017-07-01 MED ORDER — SODIUM CHLORIDE 0.9% FLUSH: 3.0000 mL | INTRAVENOUS | Status: DC | PRN

## 2017-07-01 MED ORDER — SODIUM CHLORIDE 0.9 % IV BOLUS
500.0000 mL | Freq: Once | INTRAVENOUS | Status: AC
  Administered 2017-07-01: 500 mL via INTRAVENOUS

## 2017-07-01 MED ORDER — LEVALBUTEROL HCL 1.25 MG/0.5ML IN NEBU
1.2500 mg | INHALATION_SOLUTION | Freq: Four times a day (QID) | RESPIRATORY_TRACT | Status: DC | PRN
  Filled 2017-07-01: qty 0.5

## 2017-07-01 NOTE — H&P (Signed)
History and Physical    Joel Mullins NWG:[id number] DOB: Jun 06, 1960 DOA: 07/01/2017  PCP: Tery Sanfilippo, MD   Patient coming from: Home  Chief Complaint: SOB, cough, wheeze   HPI: Joel Mullins is a 57 y.o. male with medical history significant for asthma, hypertension, and OSA on CPAP, now presenting to the emergency department with shortness of breath, coughing, and wheeze.  Patient reports that he developed worsening cough and dyspnea approximately 3 weeks ago, was started on antibiotics and prednisone several days ago, began to improve, but then experienced acute worsening this evening.  He denies fevers or chills, denies chest pain, and denies lower extremity swelling or tenderness.  No swelling, hives, or GI distress.  ED Course: Upon arrival to the ED, patient is found to be afebrile, saturating adequately on room air, tachypneic, tachycardic, and with stable blood pressure.  EKG features sinus tachycardia with rate 125 and chest x-ray is negative for acute cardiopulmonary disease.  Chemistry panel is notable for serum creatinine 1.39, up from priors in the 0.91 range.  CBC is notable for leukocytosis to 18,800 and a thrombocytosis with platelets 447,000.  Lactic acid is elevated to 3.49 and troponin is normal.  Blood cultures were collected and the patient was treated with 125 mg IV Solu-Medrol, 2 g IV magnesium, IM epinephrine, Valium, Rocephin, azithromycin, and nebs.  Patient reports improvement with these measures, remains tachycardic and mildly tachypneic, and will be admitted to the stepdown unit for ongoing evaluation and management of asthma with acute exacerbation.  Review of Systems:  All other systems reviewed and apart from HPI, are negative.  Past Medical History:  Diagnosis Date  . Hypertension     History reviewed. No pertinent surgical history.   reports that he has quit smoking. He has never used smokeless tobacco. He reports that he does not drink alcohol or  use drugs.  Allergies  Allergen Reactions  . Shellfish Allergy Nausea And Vomiting and Swelling    History reviewed. No pertinent family history.   Prior to Admission medications   Medication Sig Start Date End Date Taking? Authorizing Provider  albuterol (PROVENTIL HFA;VENTOLIN HFA) 108 (90 Base) MCG/ACT inhaler Inhale 2 puffs as needed into the lungs. 12/24/16 12/24/17  Ina Homes, MD  amLODipine (NORVASC) 2.5 MG tablet Take 2.5 mg by mouth daily.    [provider]  Azelastine-Fluticasone 137-50 MCG/ACT SUSP Place 1 spray into the nose as needed. 12/04/13   [provider]  carvedilol (COREG) 25 MG tablet Take 25 mg by mouth 2 (two) times daily with a meal.    [provider]  diclofenac sodium (VOLTAREN) 1 % GEL Apply 1 g topically 4 (four) times daily. 06/11/17   [provider]  ibuprofen (ADVIL,MOTRIN) 800 MG tablet Take 1 tablet (800 mg total) by mouth 3 (three) times daily. 02/03/17   Domenic Moras, PA-C  Promethazine-Codeine 6.25-10 MG/5ML SOLN Take 5 mLs 3 (three) times daily as needed by mouth (for cough). 12/24/16   Ina Homes, MD  sildenafil (VIAGRA) 100 MG tablet Take 100 mg by mouth daily as needed for erectile dysfunction. 04/23/17   [provider]  SYMBICORT 80-4.5 MCG/ACT inhaler Inhale 2 puffs into the lungs 2 (two) times daily. 05/24/17   [provider]  telmisartan-hydrochlorothiazide (MICARDIS HCT) 40-12.5 MG tablet Take 1 tablet by mouth daily. 06/19/17   [provider]  traMADol (ULTRAM) 50 MG tablet Take 50 mg by mouth every 6 (six) hours as needed (for pain).  [provider]  VIRTUSSIN A/C 100-10 MG/5ML syrup Take 5-10 mLs by mouth every 4 (four) hours. 06/25/17   [provider]    Physical Exam: Vitals:   07/01/17 2000 07/01/17 2015 07/01/17 2030 07/01/17 2100  BP:   (!) 121/45   Pulse: (!) 129 (!) 125 (!) 120 (!) 126  Resp: (!) 35 (!) 37 (!) 31 (!) 23  Temp:      TempSrc:       SpO2: 100% 95% 93% 95%  Weight:      Height:          Constitutional: Not in acute distress, tachypneic, coughing  Eyes: PERTLA, lids and conjunctivae normal ENMT: Mucous membranes are moist. Posterior pharynx clear of any exudate or lesions.   Neck: normal, supple, no masses, no thyromegaly Respiratory: Diminished breath sounds bilaterally, occasional wheeze, tachypnea. No pallor or cyanosis.    Cardiovascular: Rate ~120 and regular. No extremity edema. No diaphoresis. Abdomen: No distension, no tenderness, soft. Bowel sounds normal.  Musculoskeletal: no clubbing / cyanosis. No joint deformity upper and lower extremities.   Skin: no significant rashes, lesions, ulcers. Warm, dry, well-perfused. Neurologic: CN 2-12 grossly intact. Sensation intact. Strength 5/5 in all 4 limbs.  Psychiatric: Alert and oriented x 3. Calm, cooperative.     Labs on Admission: I have personally reviewed following labs and imaging studies  CBC: Recent Labs  Lab 07/01/17 1751  WBC 18.8*  HGB 15.9  HCT 46.8  MCV 94.9  PLT 768*   Basic Metabolic Panel: Recent Labs  Lab 07/01/17 1751  NA 140  K 4.0  CL 104  CO2 26  GLUCOSE 110*  BUN 24*  CREATININE 1.39*  CALCIUM 9.1   GFR: Estimated Creatinine Clearance: 82.6 mL/min (A) (by C-G formula based on SCr of 1.39 mg/dL (H)). Liver Function Tests: Recent Labs  Lab 07/01/17 1751  AST 26  ALT 27  ALKPHOS 88  BILITOT 0.7  PROT 7.1  ALBUMIN 3.7   No results for input(s): LIPASE, AMYLASE in the last 168 hours. No results for input(s): AMMONIA in the last 168 hours. Coagulation Profile: No results for input(s): INR, PROTIME in the last 168 hours. Cardiac Enzymes: No results for input(s): CKTOTAL, CKMB, CKMBINDEX, TROPONINI in the last 168 hours. BNP (last 3 results) No results for input(s): PROBNP in the last 8760 hours. HbA1C: No results for input(s): HGBA1C in the last 72 hours. CBG: No results for input(s): GLUCAP in the last  168 hours. Lipid Profile: No results for input(s): CHOL, HDL, LDLCALC, TRIG, CHOLHDL, LDLDIRECT in the last 72 hours. Thyroid Function Tests: No results for input(s): TSH, T4TOTAL, FREET4, T3FREE, THYROIDAB in the last 72 hours. Anemia Panel: No results for input(s): VITAMINB12, FOLATE, FERRITIN, TIBC, IRON, RETICCTPCT in the last 72 hours. Urine analysis: No results found for: COLORURINE, APPEARANCEUR, LABSPEC, PHURINE, GLUCOSEU, HGBUR, BILIRUBINUR, KETONESUR, PROTEINUR, UROBILINOGEN, NITRITE, LEUKOCYTESUR Sepsis Labs: @LABRCNTIP (procalcitonin:4,lacticidven:4) )No results found for this or any previous visit (from the past 240 hour(s)).   Radiological Exams on Admission: Dg Chest Portable 1 View  Result Date: 07/01/2017 CLINICAL DATA:  Very SOB , uses CPAP at home says breathing became worse this morning. EXAM: PORTABLE CHEST 1 VIEW COMPARISON:  06/25/2017 FINDINGS: Heart size is normal. The lungs are free of focal consolidations and pleural effusions. No pulmonary edema. Shallow lung inflation. IMPRESSION: No evidence for acute cardiopulmonary abnormality. Electronically Signed   By: Nolon Nations M.D.   On: 07/01/2017 18:34    EKG: Independently reviewed.  Sinus tachycardia (rate 125).   Assessment/Plan  1. Asthma with acute exacerbation  - Presents with 3 wks of cough, wheezing, and SOB, initially improving with recent abx and prednisone before worsening acutely  - He was in acute distress on arrival, oxygenating well, clear CXR, afebrile, leukocytosis on steroids, elevated lactate  - There was concern for anaphylaxis initially and he was treated with IM epinephrine; no hypotension, swelling, rash, or GI distress to support this  - No chest pain or leg swelling/tenderness to suggest PE  - Blood cultures were collected and he was treated in ED with IV magnesium, Solu-Medrol, azithromycin, Rocephin, and nebs  - Check sputum culture and procalcitonin, continue prn nebs, continue  systemic steroid, supplemental O2 prn    2. Hypertension  - BP at goal  - Continue Coreg and Norvasc, hold telmisartan-HCTZ in light of increased creatinine    3. Mild renal insufficiency  - SCr is 1.39 on admission, up from priors ~1  - Hold ARB and HCTZ, renally-dose medications, repeat chem panel in am     DVT prophylaxis: Lovenox Code Status: Full  Family Communication: Discussed with patient Consults called: None Admission status: Inpatient    Vianne Bulls, MD [facility] Pager [phone]  If 7PM-7AM, please contact night-coverage [email or web address] Password [facility]  07/01/2017, 9:07 PM

## 2017-07-01 NOTE — ED Notes (Signed)
Additional attempt to draw blood cultures unsuccessful at this time. Will not delay abx administration any further.

## 2017-07-01 NOTE — ED Triage Notes (Addendum)
Pt endorses shob and cough x 3 weeks and has been through a round of antibiotics and prednisone, much worse today. Pt has been using inhaler without relief. Pt only speaking in complete 2-3 word sentences and coughing frequently. Spo2 100% on RA. Tachy, diaphoretic and very warm to the touch.

## 2017-07-01 NOTE — ED Provider Notes (Signed)
[place] EMERGENCY DEPARTMENT Provider Note   CSN: [id number] Arrival date & time: 07/01/17  1731     History   Chief Complaint Chief Complaint  Patient presents with  . Shortness of Breath    HPI Joel Mullins is a 57 y.o. male hx of HTN, here presenting with shortness of breath, throat closing.  Patient has history of asthma and has been coughing for the last several weeks.  He is already on steroids.  Around 2 pm, he did eat some [place] food and was not sure was in it.  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  He felt that he was throat was closing at that time.  He then came over to the ER and was noted to be very tachycardic and tachypneic.   The history is provided by the patient.    Past Medical History:  Diagnosis Date  . Hypertension     Patient Active Problem List   Diagnosis Date Noted  . Asthma with acute exacerbation 12/25/2016  . Chest pain on respiration 12/23/2016  . Chest pain 12/23/2016  . OSA on CPAP 12/23/2016  . Essential hypertension 12/23/2016  . Cough     History reviewed. No pertinent surgical history.      Home Medications    Prior to Admission medications   Medication Sig Start Date End Date Taking? Authorizing Provider  albuterol (PROVENTIL HFA;VENTOLIN HFA) 108 (90 Base) MCG/ACT inhaler Inhale 2 puffs as needed into the lungs. 12/24/16 12/24/17  Ina Homes, MD  amLODipine (NORVASC) 2.5 MG tablet Take 2.5 mg by mouth daily.    [provider]  Azelastine-Fluticasone 137-50 MCG/ACT SUSP Place 1 spray into the nose as needed. 12/04/13   [provider]  azithromycin (ZITHROMAX) 250 MG tablet Take 1 tablet daily 12/24/16   Ina Homes, MD  carvedilol (COREG) 25 MG tablet Take 25 mg by mouth 2 (two) times daily with a meal.    [provider]  diclofenac sodium (VOLTAREN) 1 % GEL Apply 1 g topically 4 (four) times  daily. 06/11/17   [provider]  ibuprofen (ADVIL,MOTRIN) 800 MG tablet Take 1 tablet (800 mg total) by mouth 3 (three) times daily. 02/03/17   Domenic Moras, PA-C  predniSONE (STERAPRED UNI-PAK 21 TAB) 5 MG (21) TBPK tablet Take 5 mg by mouth taper from 4 doses each day to 1 dose and stop. 06/25/17   [provider]  Promethazine-Codeine 6.25-10 MG/5ML SOLN Take 5 mLs 3 (three) times daily as needed by mouth (for cough). 12/24/16   Ina Homes, MD  sildenafil (VIAGRA) 100 MG tablet Take 100 mg by mouth daily as needed for erectile dysfunction. 04/23/17   [provider]  SYMBICORT 80-4.5 MCG/ACT inhaler Inhale 2 puffs into the lungs 2 (two) times daily. 05/24/17   [provider]  telmisartan-hydrochlorothiazide (MICARDIS HCT) 40-12.5 MG tablet Take 1 tablet by mouth daily. 06/19/17   [provider]  traMADol (ULTRAM) 50 MG tablet Take 50 mg by mouth every 6 (six) hours as needed (for pain).    [provider]  VIRTUSSIN A/C 100-10 MG/5ML syrup Take 5-10 mLs by mouth every 4 (four) hours. 06/25/17   [provider]    Family History History reviewed. No pertinent family history.  Social History Social History   Tobacco Use  . Smoking status: Former Research scientist (life sciences)  . Smokeless tobacco: Never Used  Substance Use Topics  .  Alcohol use: No  . Drug use: No     Allergies   Shellfish allergy   Review of Systems Review of Systems  Respiratory: Positive for chest tightness and shortness of breath.   All other systems reviewed and are negative.    Physical Exam Updated Vital Signs BP (!) 121/45   Pulse (!) 120   Temp 99.7 F (37.6 C) (Axillary)   Resp (!) 31   Ht 5\' 11"  (1.803 m)   Wt 136.1 kg (300 lb)   SpO2 93%   BMI 41.84 kg/m   Physical Exam  Constitutional:  Tachypneic, mild distress   HENT:  Head: Normocephalic.  Mouth/Throat: Oropharynx is clear and moist.  Eyes: Pupils are equal, round, and reactive to light. EOM  are normal.  Neck: Normal range of motion.  ? Mild stridor   Cardiovascular:  Tachycardic   Pulmonary/Chest:  Tachypneic, + retractions ? Stridor   Abdominal: Soft. Bowel sounds are normal.  Musculoskeletal: Normal range of motion.       Right lower leg: Normal.       Left lower leg: Normal.  Neurological: He is alert.  Skin: Skin is warm. Capillary refill takes less than 2 seconds.  Psychiatric: He has a normal mood and affect.  Nursing note and vitals reviewed.    ED Treatments / Results  Labs (all labs ordered are listed, but only abnormal results are displayed) Labs Reviewed  CBC - Abnormal; Notable for the following components:      Result Value   WBC 18.8 (*)    Platelets 447 (*)    All other components within normal limits  COMPREHENSIVE METABOLIC PANEL - Abnormal; Notable for the following components:   Glucose, Bld 110 (*)    BUN 24 (*)    Creatinine, Ser 1.39 (*)    GFR calc non Af Amer 55 (*)    All other components within normal limits  I-STAT CG4 LACTIC ACID, ED - Abnormal; Notable for the following components:   Lactic Acid, Venous 3.49 (*)    All other components within normal limits  CULTURE, BLOOD (ROUTINE X 2)  CULTURE, BLOOD (ROUTINE X 2)  I-STAT TROPONIN, ED  I-STAT CG4 LACTIC ACID, ED    EKG EKG Interpretation  Date/Time:  Sunday Jul 01 2017 17:36:29 EDT Ventricular Rate:  125 PR Interval:  138 QRS Duration: 78 QT Interval:  302 QTC Calculation: 435 R Axis:   -17 Text Interpretation:  Sinus tachycardia Nonspecific ST and T wave abnormality Abnormal ECG Since last tracing rate faster Confirmed by Wandra Arthurs [phone]) on 07/01/2017 6:30:28 PM   Radiology Dg Chest Portable 1 View  Result Date: 07/01/2017 CLINICAL DATA:  Very SOB , uses CPAP at home says breathing became worse this morning. EXAM: PORTABLE CHEST 1 VIEW COMPARISON:  06/25/2017 FINDINGS: Heart size is normal. The lungs are free of focal consolidations and pleural effusions. No  pulmonary edema. Shallow lung inflation. IMPRESSION: No evidence for acute cardiopulmonary abnormality. Electronically Signed   By: Nolon Nations M.D.   On: 07/01/2017 18:34    Procedures Procedures (including critical care time)  CRITICAL CARE Performed by: Wandra Arthurs   Total critical care time: 30 minutes  Critical care time was exclusive of separately billable procedures and treating other patients.  Critical care was necessary to treat or prevent imminent or life-threatening deterioration.  Critical care was time spent personally by me on the following activities: development of treatment plan with patient and/or surrogate as  well as nursing, discussions with consultants, evaluation of patient's response to treatment, examination of patient, obtaining history from patient or surrogate, ordering and performing treatments and interventions, ordering and review of laboratory studies, ordering and review of radiographic studies, pulse oximetry and re-evaluation of patient's condition.   Medications Ordered in ED Medications  albuterol (PROVENTIL) (2.5 MG/3ML) 0.083% nebulizer solution 5 mg (5 mg Nebulization Given 07/01/17 1744)  ondansetron (ZOFRAN) 4 MG/2ML injection (4 mg  Given 07/01/17 1806)  albuterol (PROVENTIL) (2.5 MG/3ML) 0.083% nebulizer solution 10 mg (10 mg Nebulization Given 07/01/17 1821)  ipratropium (ATROVENT) nebulizer solution 0.5 mg (0.5 mg Nebulization Given 07/01/17 1821)  methylPREDNISolone sodium succinate (SOLU-MEDROL) 125 mg/2 mL injection 125 mg (125 mg Intravenous Given 07/01/17 1846)  magnesium sulfate IVPB 2 g 50 mL (0 g Intravenous Stopped 07/01/17 1936)  cefTRIAXone (ROCEPHIN) 1 g in sodium chloride 0.9 % 100 mL IVPB (0 g Intravenous Stopped 07/01/17 2017)  azithromycin (ZITHROMAX) 500 mg in sodium chloride 0.9 % 250 mL IVPB (500 mg Intravenous New Bag/Given 07/01/17 1943)  promethazine (PHENERGAN) injection 25 mg (25 mg Intravenous Given 07/01/17 1847)    EPINEPHrine (EPI-PEN) injection 0.3 mg (0.3 mg Intramuscular Given 07/01/17 1854)  HYDROcodone-acetaminophen (HYCET) 7.5-325 mg/15 ml solution 10 mL (10 mLs Oral Given 07/01/17 2006)  diazepam (VALIUM) injection 5 mg (5 mg Intravenous Given 07/01/17 2010)     Initial Impression / Assessment and Plan / ED Course  I have reviewed the triage vital signs and the nursing notes.  Pertinent labs & imaging results that were available during my care of the patient were reviewed by me and considered in my medical decision making (see chart for details).     Bryan Goin is a 57 y.o. male here with cough, wheezing.  Patient has been coughing for the last several weeks and is on prednisone already.  He ate some [place] food and very likely had anaphylaxis as well. He is in moderate distress. Will start on bipap and get ABG. Will give IM epi, solumedrol, magnesium, continuous neb. Will reassess.   7 pm His breathing improved. Still some wheezing after continuous neb. Off bipap now. ABG reassuring. Labs showed Lactate 3.4, WBC 18. Ordered rocephin, azithromycin empirically.   9:05 PM Still tachycardic and tachypneic. On nasal cannula. Hospitalist to admit for severe asthma, anaphylaxis.    Final Clinical Impressions(s) / ED Diagnoses   Final diagnoses:  Severe persistent asthma with acute exacerbation    ED Discharge Orders    None       Drenda Freeze, MD 07/01/17 2105

## 2017-07-02 DIAGNOSIS — J45901 Unspecified asthma with (acute) exacerbation: Secondary | ICD-10-CM | POA: Diagnosis present

## 2017-07-02 DIAGNOSIS — G4733 Obstructive sleep apnea (adult) (pediatric): Secondary | ICD-10-CM

## 2017-07-02 DIAGNOSIS — J9621 Acute and chronic respiratory failure with hypoxia: Secondary | ICD-10-CM

## 2017-07-02 DIAGNOSIS — Z9989 Dependence on other enabling machines and devices: Secondary | ICD-10-CM

## 2017-07-02 DIAGNOSIS — D473 Essential (hemorrhagic) thrombocythemia: Secondary | ICD-10-CM

## 2017-07-02 LAB — BLOOD GAS, ARTERIAL
ACID-BASE DEFICIT: 3.7 mmol/L — AB (ref 0.0–2.0)
Bicarbonate: 20.9 mmol/L (ref 20.0–28.0)
Delivery systems: POSITIVE
Drawn by: 257081
EXPIRATORY PAP: 8
FIO2: 30
INSPIRATORY PAP: 16
MODE: POSITIVE
O2 Saturation: 98.2 %
PH ART: 7.355 (ref 7.350–7.450)
Patient temperature: 98.6
pCO2 arterial: 38.4 mmHg (ref 32.0–48.0)
pO2, Arterial: 121 mmHg — ABNORMAL HIGH (ref 83.0–108.0)

## 2017-07-02 LAB — LACTIC ACID, PLASMA
LACTIC ACID, VENOUS: 4.8 mmol/L — AB (ref 0.5–1.9)
Lactic Acid, Venous: 2.3 mmol/L (ref 0.5–1.9)
Lactic Acid, Venous: 5.5 mmol/L (ref 0.5–1.9)
Lactic Acid, Venous: 6.9 mmol/L (ref 0.5–1.9)

## 2017-07-02 LAB — CBC WITH DIFFERENTIAL/PLATELET
Basophils Absolute: 0 10*3/uL (ref 0.0–0.1)
Basophils Relative: 0 %
EOS ABS: 0 10*3/uL (ref 0.0–0.7)
EOS PCT: 0 %
HEMATOCRIT: 42.5 % (ref 39.0–52.0)
HEMOGLOBIN: 14.2 g/dL (ref 13.0–17.0)
LYMPHS ABS: 0.8 10*3/uL (ref 0.7–4.0)
Lymphocytes Relative: 4 %
MCH: 31.6 pg (ref 26.0–34.0)
MCHC: 33.4 g/dL (ref 30.0–36.0)
MCV: 94.7 fL (ref 78.0–100.0)
MONO ABS: 0.2 10*3/uL (ref 0.1–1.0)
MONOS PCT: 1 %
Neutro Abs: 20.7 10*3/uL — ABNORMAL HIGH (ref 1.7–7.7)
Neutrophils Relative %: 95 %
Platelets: 411 10*3/uL — ABNORMAL HIGH (ref 150–400)
RBC: 4.49 MIL/uL (ref 4.22–5.81)
RDW: 14.9 % (ref 11.5–15.5)
WBC: 21.7 10*3/uL — AB (ref 4.0–10.5)

## 2017-07-02 LAB — BASIC METABOLIC PANEL
Anion gap: 16 — ABNORMAL HIGH (ref 5–15)
BUN: 24 mg/dL — ABNORMAL HIGH (ref 6–20)
CHLORIDE: 108 mmol/L (ref 101–111)
CO2: 17 mmol/L — ABNORMAL LOW (ref 22–32)
CREATININE: 1.33 mg/dL — AB (ref 0.61–1.24)
Calcium: 8.3 mg/dL — ABNORMAL LOW (ref 8.9–10.3)
GFR calc Af Amer: >60 mL/min (ref >60)
GFR calc non Af Amer: 58 mL/min — ABNORMAL LOW (ref >60)
GLUCOSE: 185 mg/dL — AB (ref 65–99)
Potassium: 3.7 mmol/L (ref 3.5–5.1)
SODIUM: 141 mmol/L (ref 135–145)

## 2017-07-02 LAB — BRAIN NATRIURETIC PEPTIDE: B Natriuretic Peptide: 9.7 pg/mL (ref 0.0–100.0)

## 2017-07-02 LAB — D-DIMER, QUANTITATIVE (NOT AT ARMC): D DIMER QUANT: 0.31 ug{FEU}/mL (ref 0.00–0.50)

## 2017-07-02 LAB — PROCALCITONIN: Procalcitonin: <0.1 ng/mL

## 2017-07-02 MED ORDER — SODIUM CHLORIDE 0.9 % IV SOLN
INTRAVENOUS | Status: DC
  Administered 2017-07-02 – 2017-07-03 (×2): via INTRAVENOUS

## 2017-07-02 MED ORDER — ALPRAZOLAM 0.25 MG PO TABS
0.2500 mg | ORAL_TABLET | Freq: Every evening | ORAL | Status: DC | PRN
Start: 2017-07-02 — End: 2017-07-05
  Administered 2017-07-02 – 2017-07-04 (×3): 0.25 mg via ORAL
  Filled 2017-07-02 (×3): qty 1

## 2017-07-02 MED ORDER — SODIUM CHLORIDE 0.9 % IV BOLUS
500.0000 mL | Freq: Once | INTRAVENOUS | Status: AC
  Administered 2017-07-02: 500 mL via INTRAVENOUS

## 2017-07-02 MED ORDER — SODIUM CHLORIDE 0.9 % IV SOLN
INTRAVENOUS | Status: AC
  Administered 2017-07-02: 10:00:00 via INTRAVENOUS

## 2017-07-02 MED ORDER — ALBUTEROL SULFATE (2.5 MG/3ML) 0.083% IN NEBU
2.5000 mg | INHALATION_SOLUTION | RESPIRATORY_TRACT | Status: DC | PRN
  Administered 2017-07-04: 2.5 mg via RESPIRATORY_TRACT

## 2017-07-02 MED ORDER — PANTOPRAZOLE SODIUM 40 MG PO TBEC
40.0000 mg | DELAYED_RELEASE_TABLET | Freq: Two times a day (BID) | ORAL | Status: DC
  Administered 2017-07-02 – 2017-07-05 (×7): 40 mg via ORAL
  Filled 2017-07-02 (×6): qty 1

## 2017-07-02 MED ORDER — IPRATROPIUM-ALBUTEROL 0.5-2.5 (3) MG/3ML IN SOLN
3.0000 mL | Freq: Four times a day (QID) | RESPIRATORY_TRACT | Status: DC
  Administered 2017-07-02 – 2017-07-03 (×6): 3 mL via RESPIRATORY_TRACT
  Filled 2017-07-02 (×6): qty 3

## 2017-07-02 MED ORDER — SODIUM CHLORIDE 0.9 % IV BOLUS
500.0000 mL | Freq: Once | INTRAVENOUS | Status: AC
Start: 2017-07-02 — End: 2017-07-02
  Administered 2017-07-02: 500 mL via INTRAVENOUS

## 2017-07-02 MED ORDER — GUAIFENESIN-DM 100-10 MG/5ML PO SYRP
5.0000 mL | ORAL_SOLUTION | ORAL | Status: DC | PRN
  Administered 2017-07-03: 5 mL via ORAL
  Filled 2017-07-02: qty 5

## 2017-07-02 MED ORDER — IBUPROFEN 400 MG PO TABS
400.0000 mg | ORAL_TABLET | Freq: Four times a day (QID) | ORAL | Status: DC | PRN
Start: 2017-07-02 — End: 2017-07-05
  Administered 2017-07-02 – 2017-07-03 (×2): 400 mg via ORAL
  Filled 2017-07-02 (×2): qty 1

## 2017-07-02 MED ORDER — BUTALBITAL-APAP-CAFFEINE 50-325-40 MG PO TABS
1.0000 | ORAL_TABLET | Freq: Four times a day (QID) | ORAL | Status: DC | PRN
  Administered 2017-07-02: 2 via ORAL
  Filled 2017-07-02: qty 2

## 2017-07-02 NOTE — Progress Notes (Signed)
PROGRESS NOTE    [name]  RKY:[id number] DOB: [birth year] DOA: 07/01/2017 PCP: Tery Sanfilippo, MD      Brief Narrative:  [name] is a 57 y.o. M with asthma, HTN, and OSA non-CPAP compliant who presents with subacute progressive wheezing and dyspnea on exertion, now severe, associated with "blackouts" with ambulation.  Tried prednisone at home, and initially some improvement, now worsening again.  In ER, CXR clear, wheezing, tight with elevated lactic acid.  Initially given epinephrine empirically for anaphylaxis, then started on solu-medrol, Mag, and nebs and admitted to hospital for asthma flare.   Assessment & Plan:   Acute hypoxic respiratory failure Very dysnpeic, severe, hypoxia and elevated lactic acid.  Lactate >4, but no fever, no indication of infection, suspect this elevation is from respiratory failure.  Procal negative. Now lactic acid rising overnight. -Check dimer -Trend lactate -Check ABG and start BiPAP if hypercarbic or hypoxic or lactate still rising -Check   Asthma exacerbation On azelastine, symbicort at home. -Continue IV Solu-Medrol -GI ppx ordered -Continue nebulized bronchodilators, scheduled and p.r.n. -Supplemental oxygen -Cough suppressant p.r.n. -Continue home Symbic as Dulera  Hypertension -Continue amlodipine, carvedilol -Hold ARB HCTZ  Sleep apnea -CPAP at night  Leukocytosis From steroids  AKI Baseline Cr 1.0, currently 1.3.   -Trend BMP       DVT prophylaxis: Lovenox Code Status: FULL Family Communication: None present MDM and disposition Plan: The below labs and imaging reports were reviewed.  The patient's status is clinically worsening, he was admitted with respiratoyr failure, now with rising lactate overnight, still breathing >20 /min, but mentating well.  Above work up initiated.  ECG this morning reviewed personally,  Shows sinus tach, no ischemic changes.  CXR reviewed, showes obesity, no focal airspace  disease, no change from 1 weeks ago.     Consultants:   None  Procedures:   None  Antimicrobials:   CTX and azithro x1    Subjective: He is moderately dyspneic, coughs terribly and is quite dyspneic with minimal exertion.  No fever, sputum.  No confusion, sleepiness.  No vomitnig.  Has some chest pain after coughing, none at rest or with exertion.  Objective: Vitals:   07/02/17 0330 07/02/17 0412 07/02/17 0700 07/02/17 0828  BP: 136/69 138/77 134/82   Pulse: 92 95 90 87  Resp: (!) 25 (!) 22  (!) 23  Temp:  97.7 F (36.5 C) 97.6 F (36.4 C)   TempSrc:  Oral Oral   SpO2: 93% 97% 96% 98%  Weight:  133.7 kg (294 lb 11.2 oz)    Height:  5\' 11"  (1.803 m)      Intake/Output Summary (Last 24 hours) at 07/02/2017 1001 Last data filed at 07/02/2017 0500 Gross per 24 hour  Intake 2190 ml  Output -  Net 2190 ml   Filed Weights   07/01/17 1738 07/02/17 0412  Weight: 136.1 kg (300 lb) 133.7 kg (294 lb 11.2 oz)    Examination: General appearance: Obese adult male, alert and in no acute distress, appears out of breath.   HEENT: Anicteric, conjunctiva pink, lids and lashes normal. No nasal deformity, discharge, epistaxis.  Lips moist, teeth normal, OP dry, no oral lesions.   Skin: Warm and dry.  No jaundice.  No suspicious rashes or lesions. Cardiac: Tachycardic, regular, nl S1-S2, no murmurs appreciated.  Capillary refill is brisk.  JVP not visible.  No LE edema.  Radial and DP pulses 2+ and symmetric. Respiratory: Tachypneic, coughs in spasms.  Very dyspneic with  the slightest exertion.  Lung sounds tight, new wheezees. Abdomen: Abdomen soft.  No TTP. No ascites, distension, hepatosplenomegaly.   MSK: No deformities or effusions. Neuro: Awake and alert.  EOMI, moves all extremities. Speech fluent.    Psych: Sensorium intact and responding to questions, attention normal. Affect normal.  Judgment and insight appear normal.    Data Reviewed: I have personally reviewed following  labs and imaging studies:  CBC: Recent Labs  Lab 07/01/17 1751 07/02/17 0217  WBC 18.8* 21.7*  NEUTROABS  --  20.7*  HGB 15.9 14.2  HCT 46.8 42.5  MCV 94.9 94.7  PLT 447* 016*   Basic Metabolic Panel: Recent Labs  Lab 07/01/17 1751 07/02/17 0217  NA 140 141  K 4.0 3.7  CL 104 108  CO2 26 17*  GLUCOSE 110* 185*  BUN 24* 24*  CREATININE 1.39* 1.33*  CALCIUM 9.1 8.3*   GFR: Estimated Creatinine Clearance: 85.5 mL/min (A) (by C-G formula based on SCr of 1.33 mg/dL (H)). Liver Function Tests: Recent Labs  Lab 07/01/17 1751  AST 26  ALT 27  ALKPHOS 88  BILITOT 0.7  PROT 7.1  ALBUMIN 3.7   No results for input(s): LIPASE, AMYLASE in the last 168 hours. No results for input(s): AMMONIA in the last 168 hours. Coagulation Profile: No results for input(s): INR, PROTIME in the last 168 hours. Cardiac Enzymes: No results for input(s): CKTOTAL, CKMB, CKMBINDEX, TROPONINI in the last 168 hours. BNP (last 3 results) No results for input(s): PROBNP in the last 8760 hours. HbA1C: No results for input(s): HGBA1C in the last 72 hours. CBG: No results for input(s): GLUCAP in the last 168 hours. Lipid Profile: No results for input(s): CHOL, HDL, LDLCALC, TRIG, CHOLHDL, LDLDIRECT in the last 72 hours. Thyroid Function Tests: No results for input(s): TSH, T4TOTAL, FREET4, T3FREE, THYROIDAB in the last 72 hours. Anemia Panel: No results for input(s): VITAMINB12, FOLATE, FERRITIN, TIBC, IRON, RETICCTPCT in the last 72 hours. Urine analysis: No results found for: COLORURINE, APPEARANCEUR, LABSPEC, PHURINE, GLUCOSEU, HGBUR, BILIRUBINUR, KETONESUR, PROTEINUR, UROBILINOGEN, NITRITE, LEUKOCYTESUR Sepsis Labs: @LABRCNTIP (procalcitonin:4,lacticacidven:4)  )No results found for this or any previous visit (from the past 240 hour(s)).       Radiology Studies: Dg Chest Portable 1 View  Result Date: 07/01/2017 CLINICAL DATA:  Very SOB , uses CPAP at home says breathing became worse  this morning. EXAM: PORTABLE CHEST 1 VIEW COMPARISON:  06/25/2017 FINDINGS: Heart size is normal. The lungs are free of focal consolidations and pleural effusions. No pulmonary edema. Shallow lung inflation. IMPRESSION: No evidence for acute cardiopulmonary abnormality. Electronically Signed   By: Nolon Nations M.D.   On: 07/01/2017 18:34        Scheduled Meds: . amLODipine  2.5 mg Oral Daily  . carvedilol  25 mg Oral BID WC  . diclofenac sodium  1 application Topical TID AC & HS  . enoxaparin (LOVENOX) injection  40 mg Subcutaneous Daily  . ipratropium-albuterol  3 mL Nebulization Q6H  . methylPREDNISolone (SOLU-MEDROL) injection  60 mg Intravenous Q6H  . mometasone-formoterol  2 puff Inhalation BID  . pantoprazole  40 mg Oral BID AC   Continuous Infusions: . sodium chloride       LOS: 1 day    Time spent: 35 minutes    Edwin Dada, MD [facility] 07/02/2017, 7:30 AM     Pager [phone] --- please page though AMION:  [email or web address] Password [email or web address] If 7PM-7AM, please contact night-coverage

## 2017-07-02 NOTE — ED Notes (Signed)
Collect next lactic acid plasm at 0300

## 2017-07-02 NOTE — Progress Notes (Signed)
Pt reports no void since last night.  Bladder scan revealed 171cc in bladder.  MD notified via text paged.  Orders already pending for BMP.

## 2017-07-02 NOTE — Progress Notes (Signed)
Respiratory aware to go to 4e17 to draw ABG.

## 2017-07-02 NOTE — Progress Notes (Signed)
Patient arrived from [facility]. A&O x 4. VSS. No complaints of pain. SR on telemetry. SOB with exertion. Decreased breath sounds. Uses C-Pap at night. No skin breakdown noted. Gen POC initiated. Low fall risk. Patient oriented to room and call system.

## 2017-07-02 NOTE — Progress Notes (Signed)
Pt feels ready to removed BiPap. SpO2 94% on 2L [place].  Coughing, respiratory therapy present to administer scheduled breathing treatment.

## 2017-07-02 NOTE — Progress Notes (Signed)
Lactic acid increased to 5.5.   Heart and respiratory rates have normalized, BP remains stable, pt clinically improving. Plan to give another 500 cc NS and otherwise continue current plan with close monitoring.

## 2017-07-02 NOTE — Progress Notes (Signed)
Report called to Ailene Ravel, RN on 4E.

## 2017-07-03 ENCOUNTER — Other Ambulatory Visit: Payer: Self-pay

## 2017-07-03 ENCOUNTER — Inpatient Hospital Stay (HOSPITAL_COMMUNITY)

## 2017-07-03 DIAGNOSIS — R0600 Dyspnea, unspecified: Secondary | ICD-10-CM

## 2017-07-03 LAB — ECHOCARDIOGRAM COMPLETE
HEIGHTINCHES: 71 in
WEIGHTICAEL: 4715.2 [oz_av]

## 2017-07-03 LAB — BASIC METABOLIC PANEL
Anion gap: 9 (ref 5–15)
BUN: 27 mg/dL — AB (ref 6–20)
CO2: 24 mmol/L (ref 22–32)
CREATININE: 1.06 mg/dL (ref 0.61–1.24)
Calcium: 8.7 mg/dL — ABNORMAL LOW (ref 8.9–10.3)
Chloride: 108 mmol/L (ref 101–111)
GFR calc Af Amer: >60 mL/min (ref >60)
GLUCOSE: 149 mg/dL — AB (ref 65–99)
POTASSIUM: 4.4 mmol/L (ref 3.5–5.1)
SODIUM: 141 mmol/L (ref 135–145)

## 2017-07-03 LAB — CBC
HEMATOCRIT: 40.6 % (ref 39.0–52.0)
Hemoglobin: 13.2 g/dL (ref 13.0–17.0)
MCH: 31.5 pg (ref 26.0–34.0)
MCHC: 32.5 g/dL (ref 30.0–36.0)
MCV: 96.9 fL (ref 78.0–100.0)
PLATELETS: 400 10*3/uL (ref 150–400)
RBC: 4.19 MIL/uL — ABNORMAL LOW (ref 4.22–5.81)
RDW: 15.7 % — AB (ref 11.5–15.5)
WBC: 24.5 10*3/uL — ABNORMAL HIGH (ref 4.0–10.5)

## 2017-07-03 LAB — TROPONIN I

## 2017-07-03 MED ORDER — TELMISARTAN-HCTZ 40-12.5 MG PO TABS: 1.0000 | ORAL_TABLET | Freq: Every day | ORAL | Status: DC

## 2017-07-03 MED ORDER — METHYLPREDNISOLONE SODIUM SUCC 40 MG IJ SOLR
40.0000 mg | Freq: Three times a day (TID) | INTRAMUSCULAR | Status: DC
  Administered 2017-07-03 – 2017-07-05 (×5): 40 mg via INTRAVENOUS
  Filled 2017-07-03 (×6): qty 1

## 2017-07-03 MED ORDER — IRBESARTAN 150 MG PO TABS
150.0000 mg | ORAL_TABLET | Freq: Every day | ORAL | Status: DC
  Administered 2017-07-04 – 2017-07-05 (×2): 150 mg via ORAL
  Filled 2017-07-03 (×2): qty 1

## 2017-07-03 MED ORDER — HYDROCHLOROTHIAZIDE 12.5 MG PO CAPS
12.5000 mg | ORAL_CAPSULE | Freq: Every day | ORAL | Status: DC
  Administered 2017-07-04 – 2017-07-05 (×2): 12.5 mg via ORAL
  Filled 2017-07-03 (×2): qty 1

## 2017-07-03 MED ORDER — FUROSEMIDE 40 MG PO TABS
40.0000 mg | ORAL_TABLET | Freq: Once | ORAL | Status: AC
  Administered 2017-07-03: 40 mg via ORAL
  Filled 2017-07-03: qty 1

## 2017-07-03 NOTE — Progress Notes (Signed)
PROGRESS NOTE    Joel Mullins  ZOX:[id number] DOB: [birth year] DOA: 07/01/2017 PCP: Tery Sanfilippo, MD      Brief Narrative:  [name] is a 57 y.o. M with asthma, HTN, and OSA non-CPAP compliant who presents with subacute progressive wheezing and dyspnea on exertion, now severe, associated with "blackouts" with ambulation.  Tried prednisone at home, and initially some improvement, now worsening again.  In ER, CXR clear, wheezing, tight with elevated lactic acid.  Initially given epinephrine empirically for anaphylaxis, then started on solu-medrol, Mag, and nebs and admitted to hospital for asthma flare.   Assessment & Plan:   Acute hypoxic respiratory failure Dyspnea has improved considerably.  We have ruled out PE, CXR shows no opacification to suggest pneumonia and procalcitonin is low.  BNP is very very low.  Lactic acid was quite elevated, in context of respiratory failure, but has improved with steroids, bronchodilators, BiPAP, and IV fluids.  His dyspnea is much better today, although he still has severe coughing.   Suspect this is all asthma.  Asthma exacerbation On azelastine, symbicort at home.  He is weaned off of supplemental oxygen. -Continue IV Solu-Medrol, taper dose -Continue GI prophylaxis -Continue nebulized bronchodilators, scheduled and as needed -Continue cough suppressant -Continue home Symbicort as Dulera   Chest pain He has chest tightness or chest discomfort both with coughing and with exertion.  This is quite atypical for angina, but within the realm of possibility.   -Will eval for RWMA with echo  -If this is normal, given normal ECG and troponins, will discuss with Cardiology but likely refer for outpatient stress    Hypertension -Continue amlodipine, carvedilol -Restart ARB, HCTZ    Swelling BNP normal.  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 -Lasix once  Sleep apnea -CPAP at night  AKI Resolved.          DVT prophylaxis: Lovenox Code  Status: FULL Family Communication: None present MDM and disposition Plan:  The below labs and imaging reports were reviewed and summarized above.  Patient status is clinically improving.  He was admitted with acute respiratory failure hypoxia requiring BiPAP, likely from asthma.  The above work-up so far were negative.  We will continue to treat with high-dose IV steroids, nebulized bronchodilators frequently.  We will obtain an echocardiogram to evaluate for heart disease.  His echocardiogram was normal as I suspect it will be, we will continue steroids until he is exercise tolerance is improved and then we will discharged home.     Consultants:   None  Procedures:   None  Antimicrobials:   CTX and azithro x1    Subjective: Sitting up in a chair, interactive.  Occasional spasms of severe cough.  No fever, sputum, confusion, lethargy, sleepiness.  No vomiting.  Mild vague chest pain with coughing or exertion.     Objective: Vitals:   07/03/17 0402 07/03/17 0818 07/03/17 0931 07/03/17 1435  BP: 126/67 (!) 148/74    Pulse: (!) 52     Resp: 18     Temp: 97.8 F (36.6 C) 98.7 F (37.1 C)    TempSrc: Axillary     SpO2: 96%  98% 100%  Weight:      Height:        Intake/Output Summary (Last 24 hours) at 07/03/2017 1630 Last data filed at 07/03/2017 1300 Gross per 24 hour  Intake 1280.42 ml  Output 1450 ml  Net -169.58 ml   Filed Weights   07/01/17 1738 07/02/17 0412  Weight: 136.1  kg (300 lb) 133.7 kg (294 lb 11.2 oz)    Examination: General appearance: Obese adult male, sitting in the chair, no acute distress. HEENT: Anicteric, conjunctival pink, lids and lashes normal.  No nasal deformity, discharge, epistaxis.  Lips moist, teeth normal, OP moist, no oral lesions. Skin: Skin warm and dry without suspicious rashes or lesions. Cardiac: RRR, 1+ pitting lower extremity edema, no systolic murmurs.  JVP not visible. Respiratory: Respiratory effort normal, frequent  coughing.  Dyspneic with little exertion.  No wheezing actually, lungs sound overall tight, no rales. Abdomen: Abdomen soft without tenderness to palpation or hepatosplenomegaly. MSK: No deformities or effusions. Neuro: Awake and alert, extraocular movements intact, PERRLA, speech fluent, moves all extremities with normal strength coordination.    Psych: Sensorium intact and responding to questions, affect normal, attention normal.    Data Reviewed: I have personally reviewed following labs and imaging studies:  CBC: Recent Labs  Lab 07/01/17 1751 07/02/17 0217 07/03/17 0350  WBC 18.8* 21.7* 24.5*  NEUTROABS  --  20.7*  --   HGB 15.9 14.2 13.2  HCT 46.8 42.5 40.6  MCV 94.9 94.7 96.9  PLT 447* 411* 749   Basic Metabolic Panel: Recent Labs  Lab 07/01/17 1751 07/02/17 0217 07/03/17 0350  NA 140 141 141  K 4.0 3.7 4.4  CL 104 108 108  CO2 26 17* 24  GLUCOSE 110* 185* 149*  BUN 24* 24* 27*  CREATININE 1.39* 1.33* 1.06  CALCIUM 9.1 8.3* 8.7*   GFR: Estimated Creatinine Clearance: 107.3 mL/min (by C-G formula based on SCr of 1.06 mg/dL). Liver Function Tests: Recent Labs  Lab 07/01/17 1751  AST 26  ALT 27  ALKPHOS 88  BILITOT 0.7  PROT 7.1  ALBUMIN 3.7   No results for input(s): LIPASE, AMYLASE in the last 168 hours. No results for input(s): AMMONIA in the last 168 hours. Coagulation Profile: No results for input(s): INR, PROTIME in the last 168 hours. Cardiac Enzymes: Recent Labs  Lab 07/03/17 0350  TROPONINI <0.03   BNP (last 3 results) No results for input(s): PROBNP in the last 8760 hours. HbA1C: No results for input(s): HGBA1C in the last 72 hours. CBG: No results for input(s): GLUCAP in the last 168 hours. Lipid Profile: No results for input(s): CHOL, HDL, LDLCALC, TRIG, CHOLHDL, LDLDIRECT in the last 72 hours. Thyroid Function Tests: No results for input(s): TSH, T4TOTAL, FREET4, T3FREE, THYROIDAB in the last 72 hours. Anemia Panel: No results  for input(s): VITAMINB12, FOLATE, FERRITIN, TIBC, IRON, RETICCTPCT in the last 72 hours. Urine analysis: No results found for: COLORURINE, APPEARANCEUR, LABSPEC, PHURINE, GLUCOSEU, HGBUR, BILIRUBINUR, KETONESUR, PROTEINUR, UROBILINOGEN, NITRITE, LEUKOCYTESUR Sepsis Labs: @LABRCNTIP (procalcitonin:4,lacticacidven:4)  ) Recent Results (from the past 240 hour(s))  Blood culture (routine x 2)     Status: None (Preliminary result)   Collection Time: 07/01/17  8:26 PM  Result Value Ref Range Status   Specimen Description BLOOD BLOOD LEFT FOREARM  Final   Special Requests   Final    BOTTLES DRAWN AEROBIC AND ANAEROBIC Blood Culture adequate volume   Culture   Final    NO GROWTH 2 DAYS Performed at [facility], [address] [place], [place] [address]    Report Status PENDING  Incomplete  Blood culture (routine x 2)     Status: None (Preliminary result)   Collection Time: 07/01/17 11:55 PM  Result Value Ref Range Status   Specimen Description BLOOD LEFT HAND  Final   Special Requests   Final  BOTTLES DRAWN AEROBIC AND ANAEROBIC Blood Culture adequate volume   Culture   Final    NO GROWTH 1 DAY Performed at [facility], [place] [address], [place],  [address]    Report Status PENDING  Incomplete         Radiology Studies: Dg Chest Portable 1 View  Result Date: 07/01/2017 CLINICAL DATA:  Very SOB , uses CPAP at home says breathing became worse this morning. EXAM: PORTABLE CHEST 1 VIEW COMPARISON:  06/25/2017 FINDINGS: Heart size is normal. The lungs are free of focal consolidations and pleural effusions. No pulmonary edema. Shallow lung inflation. IMPRESSION: No evidence for acute cardiopulmonary abnormality. Electronically Signed   By: Nolon Nations M.D.   On: 07/01/2017 18:34        Scheduled Meds: . amLODipine  2.5 mg Oral Daily  . carvedilol  25 mg Oral BID WC  . diclofenac sodium  1 application Topical TID AC & HS  . enoxaparin (LOVENOX) injection   40 mg Subcutaneous Daily  . ipratropium-albuterol  3 mL Nebulization Q6H  . methylPREDNISolone (SOLU-MEDROL) injection  40 mg Intravenous TID  . mometasone-formoterol  2 puff Inhalation BID  . pantoprazole  40 mg Oral BID AC   Continuous Infusions:    LOS: 2 days    Time spent: 25 minutes    Edwin Dada, MD [facility] 07/03/2017, 14:00      Pager [phone] --- please page though AMION:  [email or web address] Password [email or web address] If 7PM-7AM, please contact night-coverage

## 2017-07-03 NOTE — Progress Notes (Signed)
  Echocardiogram 2D Echocardiogram has been performed.  Merrie Roof F 07/03/2017, 3:29 PM

## 2017-07-04 DIAGNOSIS — R079 Chest pain, unspecified: Secondary | ICD-10-CM

## 2017-07-04 LAB — CBC
HEMATOCRIT: 41 % (ref 39.0–52.0)
Hemoglobin: 13.6 g/dL (ref 13.0–17.0)
MCH: 31.4 pg (ref 26.0–34.0)
MCHC: 33.2 g/dL (ref 30.0–36.0)
MCV: 94.7 fL (ref 78.0–100.0)
PLATELETS: 405 10*3/uL — AB (ref 150–400)
RBC: 4.33 MIL/uL (ref 4.22–5.81)
RDW: 14.9 % (ref 11.5–15.5)
WBC: 17.2 10*3/uL — ABNORMAL HIGH (ref 4.0–10.5)

## 2017-07-04 LAB — BASIC METABOLIC PANEL
Anion gap: 8 (ref 5–15)
BUN: 26 mg/dL — AB (ref 6–20)
CHLORIDE: 104 mmol/L (ref 101–111)
CO2: 28 mmol/L (ref 22–32)
Calcium: 8.8 mg/dL — ABNORMAL LOW (ref 8.9–10.3)
Creatinine, Ser: 1.02 mg/dL (ref 0.61–1.24)
Glucose, Bld: 146 mg/dL — ABNORMAL HIGH (ref 65–99)
POTASSIUM: 4.2 mmol/L (ref 3.5–5.1)
SODIUM: 140 mmol/L (ref 135–145)

## 2017-07-04 MED ORDER — IPRATROPIUM-ALBUTEROL 0.5-2.5 (3) MG/3ML IN SOLN
3.0000 mL | Freq: Three times a day (TID) | RESPIRATORY_TRACT | Status: DC
  Administered 2017-07-04 (×2): 3 mL via RESPIRATORY_TRACT
  Filled 2017-07-04 (×2): qty 3

## 2017-07-04 NOTE — Progress Notes (Signed)
PROGRESS NOTE    Joel Mullins  WLS:[id number] DOB: 05/26/1960 DOA: 07/01/2017 PCP: Tery Sanfilippo, MD      Brief Narrative:  Joel Mullins is a 57 y.o. M with asthma, HTN, and OSA non-CPAP compliant who presents with subacute progressive wheezing and dyspnea on exertion, now severe, associated with "blackouts" with ambulation.  Tried prednisone at home, and initially some improvement, now worsening again.  In ER, CXR clear, wheezing, tight with elevated lactic acid.  Initially given epinephrine empirically for anaphylaxis, then started on solu-medrol, Mag, and nebs and admitted to hospital for asthma flare.   Assessment & Plan:   Acute hypoxic respiratory failure Dyspnea has improved considerably.  We have ruled out PE, CXR shows no opacification to suggest pneumonia and procalcitonin is low.  BNP is very very low.  Lactic acid was quite elevated, in context of respiratory failure, but has improved with steroids, bronchodilators, BiPAP, and IV fluids.  His dyspnea is much better today, although he still has severe coughing.   Suspect this is all asthma.     Asthma exacerbation On azelastine, symbicort at home.  He is weaned off of supplemental oxygen. -Transition to prednisone tomorrow -Continue GI ppx -Continue nebulized bronchodilators, scheduled and p.r.n. -Continue cough suppressant -Continue home Symbicort as Dulera   Chest pain He has chest tightness or chest discomfort both with coughing and with exertion.  This is quite atypical for angina, but within the realm of possibility.  Echo normal.  With normal troponins and normal ECG, no active ACS, defer to outpatient -Stress test as an outpatient, this is already being arranged by his PCP  Hypertension -Continue amlodipine, carvedilol, ARB, HCTZ    Swelling BNP normal, echo normal.  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  Sleep apnea -CPAP at night  AKI Resolved.          DVT prophylaxis: Lovenox Code Status:  FULL Family Communication: None present MDM and disposition Plan: Below labs and imaging reports were reviewed and summarized above.  The patient is clinically improving.   He was admitted with acute respiratory failure hypoxia requiring BiPAP, likely from asthma.  The above work-up so far were negative.  We will taper to oral steroids tomorrow, and we will continue steroids until he is exercise tolerance is improved and then we will discharged home.     Consultants:   None  Procedures:   Echocardiogram Study Conclusions  - Left ventricle: The cavity size was normal. There was mild   concentric hypertrophy. Systolic function was normal. Wall motion   was normal; there were no regional wall motion abnormalities.   Left ventricular diastolic function parameters were normal. - Aortic valve: There was no regurgitation. - Mitral valve: There was no regurgitation. - Left atrium: The atrium was moderately dilated. - Right ventricle: Systolic function was normal. - Right atrium: The atrium was normal in size. - Tricuspid valve: There was no regurgitation. - Pulmonary arteries: Systolic pressure could not be accurately   estimated. - Inferior vena cava: The vessel was normal in size. - Pericardium, extracardiac: There was no pericardial effusion.      Antimicrobials:   CTX and azithro x1    Subjective: Sitting up, interactive.  Coughing is less.  No fever, sputum.  He is still very dyspneic with any exertion, a little bit of chest discomfort.  Objective: Vitals:   07/03/17 2322 07/04/17 0046 07/04/17 0449 07/04/17 0844  BP: (!) 138/91  129/70   Pulse: (!) 104 65 87  Resp: 19 18 16    Temp: 97.9 F (36.6 C)  97.8 F (36.6 C)   TempSrc: Oral  Oral   SpO2: 98% 94% 98% 97%  Weight:      Height:       No intake or output data in the 24 hours ending 07/04/17 1537 Filed Weights   07/01/17 1738 07/02/17 0412  Weight: 136.1 kg (300 lb) 133.7 kg (294 lb 11.2 oz)     Examination: General appearance: Obese male, sitting in a chair, no acute distress, interactive. HEENT: Anicteric, conjunctive are pink, lids and lashes normal.  No nasal deformity, discharge, or epistaxis.  Lips and teeth normal, OP moist, no lower lesions. Skin: Skin is warm and dry without suspicious rashes or lesions. Cardiac: RRR, nonpitting lower extremity edema, no systolic murmur AVP not visible. Respiratory: Respiratory effort normal at rest, dyspneic with any exertion.  No wheezing, no rales, better air movement today. Abdomen: Abdomen soft without tenderness to palpation or hepatosplenomegaly, ascites, or distention. MSK: No deformities or effusions. Neuro: Awake and alert, extraocular movements intact, PERRLA, speech fluent, moves all extremities with normal strength and coordination, gait normal.    Psych: Sensorium intact and responding to questions, affect normal, attention normal.    Data Reviewed: I have personally reviewed following labs and imaging studies:  CBC: Recent Labs  Lab 07/01/17 1751 07/02/17 0217 07/03/17 0350 07/04/17 0327  WBC 18.8* 21.7* 24.5* 17.2*  NEUTROABS  --  20.7*  --   --   HGB 15.9 14.2 13.2 13.6  HCT 46.8 42.5 40.6 41.0  MCV 94.9 94.7 96.9 94.7  PLT 447* 411* 400 193*   Basic Metabolic Panel: Recent Labs  Lab 07/01/17 1751 07/02/17 0217 07/03/17 0350 07/04/17 0327  NA 140 141 141 140  K 4.0 3.7 4.4 4.2  CL 104 108 108 104  CO2 26 17* 24 28  GLUCOSE 110* 185* 149* 146*  BUN 24* 24* 27* 26*  CREATININE 1.39* 1.33* 1.06 1.02  CALCIUM 9.1 8.3* 8.7* 8.8*   GFR: Estimated Creatinine Clearance: 111.5 mL/min (by C-G formula based on SCr of 1.02 mg/dL). Liver Function Tests: Recent Labs  Lab 07/01/17 1751  AST 26  ALT 27  ALKPHOS 88  BILITOT 0.7  PROT 7.1  ALBUMIN 3.7   No results for input(s): LIPASE, AMYLASE in the last 168 hours. No results for input(s): AMMONIA in the last 168 hours. Coagulation Profile: No  results for input(s): INR, PROTIME in the last 168 hours. Cardiac Enzymes: Recent Labs  Lab 07/03/17 0350  TROPONINI <0.03   BNP (last 3 results) No results for input(s): PROBNP in the last 8760 hours. HbA1C: No results for input(s): HGBA1C in the last 72 hours. CBG: No results for input(s): GLUCAP in the last 168 hours. Lipid Profile: No results for input(s): CHOL, HDL, LDLCALC, TRIG, CHOLHDL, LDLDIRECT in the last 72 hours. Thyroid Function Tests: No results for input(s): TSH, T4TOTAL, FREET4, T3FREE, THYROIDAB in the last 72 hours. Anemia Panel: No results for input(s): VITAMINB12, FOLATE, FERRITIN, TIBC, IRON, RETICCTPCT in the last 72 hours. Urine analysis: No results found for: COLORURINE, APPEARANCEUR, LABSPEC, PHURINE, GLUCOSEU, HGBUR, BILIRUBINUR, KETONESUR, PROTEINUR, UROBILINOGEN, NITRITE, LEUKOCYTESUR Sepsis Labs: @LABRCNTIP (procalcitonin:4,lacticacidven:4)  ) Recent Results (from the past 240 hour(s))  Blood culture (routine x 2)     Status: None (Preliminary result)   Collection Time: 07/01/17  8:26 PM  Result Value Ref Range Status   Specimen Description BLOOD BLOOD LEFT FOREARM  Final   Special Requests  Final    BOTTLES DRAWN AEROBIC AND ANAEROBIC Blood Culture adequate volume   Culture   Final    NO GROWTH 3 DAYS Performed at [facility], [place] [address] [place], [place] [address]    Report Status PENDING  Incomplete  Blood culture (routine x 2)     Status: None (Preliminary result)   Collection Time: 07/01/17 11:55 PM  Result Value Ref Range Status   Specimen Description BLOOD LEFT HAND  Final   Special Requests   Final    BOTTLES DRAWN AEROBIC AND ANAEROBIC Blood Culture adequate volume   Culture   Final    NO GROWTH 2 DAYS Performed at [facility], [place] [address] [place], [place] [address]    Report Status PENDING  Incomplete         Radiology Studies: No results found.      Scheduled Meds: . amLODipine  2.5 mg Oral  Daily  . carvedilol  25 mg Oral BID WC  . diclofenac sodium  1 application Topical TID AC & HS  . enoxaparin (LOVENOX) injection  40 mg Subcutaneous Daily  . irbesartan  150 mg Oral Daily   And  . hydrochlorothiazide  12.5 mg Oral Daily  . ipratropium-albuterol  3 mL Nebulization TID  . methylPREDNISolone (SOLU-MEDROL) injection  40 mg Intravenous TID  . mometasone-formoterol  2 puff Inhalation BID  . pantoprazole  40 mg Oral BID AC   Continuous Infusions:    LOS: 3 days    Time spent: 25 minutes    Edwin Dada, MD [facility] 07/04/2017, 11:30 AM     Pager [phone] --- please page though AMION:  [email or web address] Password [email or web address] If 7PM-7AM, please contact night-coverage

## 2017-07-05 LAB — BASIC METABOLIC PANEL
Anion gap: 10 (ref 5–15)
BUN: 19 mg/dL (ref 6–20)
CO2: 25 mmol/L (ref 22–32)
Calcium: 8.5 mg/dL — ABNORMAL LOW (ref 8.9–10.3)
Chloride: 103 mmol/L (ref 101–111)
Creatinine, Ser: 0.94 mg/dL (ref 0.61–1.24)
Glucose, Bld: 137 mg/dL — ABNORMAL HIGH (ref 65–99)
Potassium: 4.6 mmol/L (ref 3.5–5.1)
SODIUM: 138 mmol/L (ref 135–145)

## 2017-07-05 LAB — CBC
HEMATOCRIT: 42.4 % (ref 39.0–52.0)
Hemoglobin: 14.2 g/dL (ref 13.0–17.0)
MCH: 31 pg (ref 26.0–34.0)
MCHC: 33.5 g/dL (ref 30.0–36.0)
MCV: 92.6 fL (ref 78.0–100.0)
PLATELETS: 422 10*3/uL — AB (ref 150–400)
RBC: 4.58 MIL/uL (ref 4.22–5.81)
RDW: 14.3 % (ref 11.5–15.5)
WBC: 11.5 10*3/uL — AB (ref 4.0–10.5)

## 2017-07-05 MED ORDER — BUTALBITAL-APAP-CAFFEINE 50-325-40 MG PO TABS: 1.0000 | ORAL_TABLET | Freq: Four times a day (QID) | ORAL | 0 refills | Status: DC | PRN

## 2017-07-05 MED ORDER — AMLODIPINE BESYLATE 2.5 MG PO TABS: 2.5000 mg | ORAL_TABLET | Freq: Every day | ORAL | 0 refills | Status: DC

## 2017-07-05 MED ORDER — PREDNISONE 10 MG (21) PO TBPK: ORAL_TABLET | ORAL | 1 refills | Status: DC

## 2017-07-05 MED ORDER — PANTOPRAZOLE SODIUM 40 MG PO TBEC: 40.0000 mg | DELAYED_RELEASE_TABLET | Freq: Two times a day (BID) | ORAL | 0 refills | Status: AC

## 2017-07-05 MED ORDER — CARVEDILOL 25 MG PO TABS: 25.0000 mg | ORAL_TABLET | Freq: Two times a day (BID) | ORAL | 0 refills | Status: DC

## 2017-07-05 NOTE — Plan of Care (Signed)
Pt is adequate for discharge. 

## 2017-07-05 NOTE — Progress Notes (Signed)
Discharge instructions given to patient and spouse.  Discussed medication changes, new medications and stopping of certain medications.  Discussed follow up appointments and signs and symptoms to contact the physician for.  Patient verbalized understanding.

## 2017-07-05 NOTE — Discharge Summary (Signed)
Physician Discharge Summary  Joel Mullins NFA:[id number] DOB: 06/13/1960 DOA: 07/01/2017  PCP: Tery Sanfilippo, MD  Admit date: 07/01/2017 Discharge date: 07/05/2017  Admitted From home Disposition: home  Recommendations for Outpatient Follow-up:  1. Follow up with PCP in 1-2 weeks 2. Please obtain BMP/CBC in one week  Home Health none Equipment/Devices none Discharge Conditionstable CODE STATUS:full Diet recommendation: cardiac Brief/Interim Summary: 57 y.o. M with asthma, HTN, and OSA non-CPAP compliant who presents with subacute progressive wheezing and dyspnea on exertion, now severe, associated with "blackouts" with ambulation.  Tried prednisone at home, and initially some improvement, now worsening again.  In ER, CXR clear, wheezing, tight with elevated lactic acid.  Initially given epinephrine empirically for anaphylaxis, then started on solu-medrol, Mag, and nebs and admitted to hospital for asthma flare.      Discharge Diagnoses:  Principal Problem:   Asthma with acute exacerbation Active Problems:   OSA on CPAP   Essential hypertension   Mild renal insufficiency   Asthma exacerbation  Acute hypoxic respiratory failure Dyspnea has improved considerably.  We have ruled out PE, CXR shows no opacification to suggest pneumonia and procalcitonin is low.  BNP is very very low.  Lactic acid was quite elevated, in context of respiratory failure, but has improved with steroids, bronchodilators, BiPAP, and IV fluids.  His dyspnea is much better today, although he still has severe coughing.   Suspect this is all asthma.  he is ambulating without oxygen .feels he is ready to go home and requesting to go home. Asthma exacerbation On azelastine, symbicort at home.  He is weaned off of supplemental oxygen.start prednisone. -Continue home Symbicort as Dulera   Chest pain atypical He has chest tightness or chest discomfort both with coughing and with exertion.  This is quite  atypical for angina, but within the realm of possibility.  echo  -Will eval for RWMA with echo Left ventricle: The cavity size was normal. There was mild   concentric hypertrophy. Systolic function was normal. Wall motion   was normal; there were no regional wall motion abnormalities.   Left ventricular diastolic function parameters were normal. - Aortic valve: There was no regurgitation. - Mitral valve: There was no regurgitation. - Left atrium: The atrium was moderately dilated. - Right ventricle: Systolic function was normal. - Right atrium: The atrium was normal in size. - Tricuspid valve: There was no regurgitation. - Pulmonary arteries: Systolic pressure could not be accurately   estimated. - Inferior vena cava: The vessel was normal in size. - Pericardium, extracardiac: There was no pericardial effusion Hypertension -Continue amlodipine, carvedilol -Restart ARB, HCTZ    Swelling BNP normal.  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 -Lasix once  Sleep apnea -CPAP at night  AKI Resolved     Discharge Instructions   Allergies as of 07/05/2017      Reactions   Shellfish Allergy Nausea And Vomiting, Swelling      Medication List    STOP taking these medications   ibuprofen 800 MG tablet Commonly known as:  ADVIL,MOTRIN     TAKE these medications   amLODipine 2.5 MG tablet Commonly known as:  NORVASC Take 1 tablet (2.5 mg total) by mouth daily. What changed:    medication strength  how much to take   butalbital-acetaminophen-caffeine 50-325-40 MG tablet Commonly known as:  FIORICET, ESGIC Take 1-2 tablets by mouth every 6 (six) hours as needed for headache.   diclofenac sodium 1 % Gel Commonly known as:  VOLTAREN Apply 1  g topically 4 (four) times daily.   pantoprazole 40 MG tablet Commonly known as:  PROTONIX Take 1 tablet (40 mg total) by mouth 2 (two) times daily before a meal.   predniSONE 10 MG (21) Tbpk tablet Commonly known as:  STERAPRED UNI-PAK  21 TAB Take 3 tab for first 3 days then 2tab for next 3 days then take one tab till done.   sildenafil 100 MG tablet Commonly known as:  VIAGRA Take 100 mg by mouth daily as needed for erectile dysfunction.   SYMBICORT 80-4.5 MCG/ACT inhaler Generic drug:  budesonide-formoterol Inhale 2 puffs into the lungs 2 (two) times daily.   telmisartan-hydrochlorothiazide 40-12.5 MG tablet Commonly known as:  MICARDIS HCT Take 1 tablet by mouth daily.   traMADol 50 MG tablet Commonly known as:  ULTRAM Take 50 mg by mouth every 6 (six) hours as needed (for pain).   VIRTUSSIN A/C 100-10 MG/5ML syrup Generic drug:  guaiFENesin-codeine Take 5-10 mLs by mouth every 4 (four) hours.       Allergies  Allergen Reactions  . Shellfish Allergy Nausea And Vomiting and Swelling    Consultations:  none   Procedures/Studies: Ct Abdomen Wo Contrast  Addendum Date: 06/07/2017   ADDENDUM REPORT: 06/07/2017 09:17 ADDENDUM: Upon further review, 3 mm nodule is seen in right lower lobe adjacent to right major fissure best seen on image number 74 of series 6. No follow-up needed if patient is low-risk. Non-contrast chest CT can be considered in 12 months if patient is high-risk. This recommendation follows the consensus statement: Guidelines for Management of Incidental Pulmonary Nodules Detected on CT Images: From the Fleischner Society 2017; Radiology 2017; 284:228-243. Several other calcified small granulomata are noted as well. Electronically Signed   By: Marijo Conception, M.D.   On: 06/07/2017 09:17   Result Date: 06/07/2017 CLINICAL DATA:  Shortness of breath, liver lesions. EXAM: CT CHEST AND ABDOMEN WITHOUT CONTRAST TECHNIQUE: Multidetector CT imaging of the chest and abdomen was performed following the standard protocol without intravenous contrast. COMPARISON:  CT scans of February 09, 2016 and August 21, 2008. FINDINGS: CT CHEST FINDINGS WITHOUT CONTRAST Cardiovascular: No significant vascular findings.  Normal heart size. No pericardial effusion. Mediastinum/Nodes: No enlarged mediastinal or axillary lymph nodes. Thyroid gland, trachea, and esophagus demonstrate no significant findings. Lungs/Pleura: Lungs are clear. No pleural effusion or pneumothorax. Musculoskeletal: No chest wall mass or suspicious bone lesions identified. CT ABDOMEN FINDINGS WITHOUT CONTRAST Hepatobiliary: No gallstones are noted. Stable hepatic low densities are noted most consistent with cysts. No biliary dilatation is noted. Pancreas: Unremarkable. No pancreatic ductal dilatation or surrounding inflammatory changes. Spleen: Normal in size without focal abnormality. Adrenals/Urinary Tract: Adrenal glands and kidneys are unremarkable. No renal calculi are noted. No hydronephrosis or renal obstruction is noted. Stomach/Bowel: Visualized stomach and bowel loops are unremarkable. Vascular/Lymphatic: No significant vascular findings are present. No enlarged abdominal lymph nodes. Other: No hernia or ascites is noted. Musculoskeletal: No acute or significant osseous findings. IMPRESSION: Stable hepatic low densities are noted most consistent with benign cysts. No other significant abnormality seen in the chest or abdomen. Electronically Signed: By: Marijo Conception, M.D. On: 06/07/2017 08:59   Dg Chest 2 View  Result Date: 06/25/2017 CLINICAL DATA:  Productive cough for 7 days. EXAM: CHEST - 2 VIEW COMPARISON:  December 24, 2016 FINDINGS: The heart, hila, and mediastinum are normal. No pneumothorax. No nodules or masses. No focal infiltrates. IMPRESSION: No active cardiopulmonary disease. Electronically Signed   By: Shanon Brow  Jimmye Norman III M.D   On: 06/25/2017 15:25   Ct Chest Wo Contrast  Addendum Date: 06/07/2017   ADDENDUM REPORT: 06/07/2017 09:17 ADDENDUM: Upon further review, 3 mm nodule is seen in right lower lobe adjacent to right major fissure best seen on image number 74 of series 6. No follow-up needed if patient is low-risk.  Non-contrast chest CT can be considered in 12 months if patient is high-risk. This recommendation follows the consensus statement: Guidelines for Management of Incidental Pulmonary Nodules Detected on CT Images: From the Fleischner Society 2017; Radiology 2017; 284:228-243. Several other calcified small granulomata are noted as well. Electronically Signed   By: Marijo Conception, M.D.   On: 06/07/2017 09:17   Result Date: 06/07/2017 CLINICAL DATA:  Shortness of breath, liver lesions. EXAM: CT CHEST AND ABDOMEN WITHOUT CONTRAST TECHNIQUE: Multidetector CT imaging of the chest and abdomen was performed following the standard protocol without intravenous contrast. COMPARISON:  CT scans of February 09, 2016 and August 21, 2008. FINDINGS: CT CHEST FINDINGS WITHOUT CONTRAST Cardiovascular: No significant vascular findings. Normal heart size. No pericardial effusion. Mediastinum/Nodes: No enlarged mediastinal or axillary lymph nodes. Thyroid gland, trachea, and esophagus demonstrate no significant findings. Lungs/Pleura: Lungs are clear. No pleural effusion or pneumothorax. Musculoskeletal: No chest wall mass or suspicious bone lesions identified. CT ABDOMEN FINDINGS WITHOUT CONTRAST Hepatobiliary: No gallstones are noted. Stable hepatic low densities are noted most consistent with cysts. No biliary dilatation is noted. Pancreas: Unremarkable. No pancreatic ductal dilatation or surrounding inflammatory changes. Spleen: Normal in size without focal abnormality. Adrenals/Urinary Tract: Adrenal glands and kidneys are unremarkable. No renal calculi are noted. No hydronephrosis or renal obstruction is noted. Stomach/Bowel: Visualized stomach and bowel loops are unremarkable. Vascular/Lymphatic: No significant vascular findings are present. No enlarged abdominal lymph nodes. Other: No hernia or ascites is noted. Musculoskeletal: No acute or significant osseous findings. IMPRESSION: Stable hepatic low densities are noted most  consistent with benign cysts. No other significant abnormality seen in the chest or abdomen. Electronically Signed: By: Marijo Conception, M.D. On: 06/07/2017 08:59   Dg Chest Portable 1 View  Result Date: 07/01/2017 CLINICAL DATA:  Very SOB , uses CPAP at home says breathing became worse this morning. EXAM: PORTABLE CHEST 1 VIEW COMPARISON:  06/25/2017 FINDINGS: Heart size is normal. The lungs are free of focal consolidations and pleural effusions. No pulmonary edema. Shallow lung inflation. IMPRESSION: No evidence for acute cardiopulmonary abnormality. Electronically Signed   By: Nolon Nations M.D.   On: 07/01/2017 18:34    (Echo, Carotid, EGD, Colonoscopy, ERCP)    Subjective:   Discharge Exam: Vitals:   07/04/17 2243 07/05/17 0415  BP:  131/71  Pulse: (!) 54 (!) 44  Resp: 18 19  Temp:  (!) 97.2 F (36.2 C)  SpO2: 99% 97%   Vitals:   07/04/17 2042 07/04/17 2216 07/04/17 2243 07/05/17 0415  BP:  129/71  131/71  Pulse:  (!) 59 (!) 54 (!) 44  Resp:  (!) 27 18 19   Temp:  97.7 F (36.5 C)  (!) 97.2 F (36.2 C)  TempSrc:  Oral  Axillary  SpO2: 98% 98% 99% 97%  Weight:      Height:        General: Pt is alert, awake, not in acute distress Cardiovascular: RRR, S1/S2 +, no rubs, no gallops Respiratory: CTA bilaterally, no wheezing, no rhonchi Abdominal: Soft, NT, ND, bowel sounds + Extremities: no edema, no cyanosis    The results of significant diagnostics from  this hospitalization (including imaging, microbiology, ancillary and laboratory) are listed below for reference.     Microbiology: Recent Results (from the past 240 hour(s))  Blood culture (routine x 2)     Status: None (Preliminary result)   Collection Time: 07/01/17  8:26 PM  Result Value Ref Range Status   Specimen Description BLOOD BLOOD LEFT FOREARM  Final   Special Requests   Final    BOTTLES DRAWN AEROBIC AND ANAEROBIC Blood Culture adequate volume   Culture   Final    NO GROWTH 3 DAYS Performed at  [facility], [address] [place], [place] [address]    Report Status PENDING  Incomplete  Blood culture (routine x 2)     Status: None (Preliminary result)   Collection Time: 07/01/17 11:55 PM  Result Value Ref Range Status   Specimen Description BLOOD LEFT HAND  Final   Special Requests   Final    BOTTLES DRAWN AEROBIC AND ANAEROBIC Blood Culture adequate volume   Culture   Final    NO GROWTH 2 DAYS Performed at [facility], [place] [address] [place], [place] [address]    Report Status PENDING  Incomplete     Labs: BNP (last 3 results) Recent Labs    07/02/17 0217  BNP 9.7   Basic Metabolic Panel: Recent Labs  Lab 07/01/17 1751 07/02/17 0217 07/03/17 0350 07/04/17 0327 07/05/17 0355  NA 140 141 141 140 138  K 4.0 3.7 4.4 4.2 4.6  CL 104 108 108 104 103  CO2 26 17* 24 28 25   GLUCOSE 110* 185* 149* 146* 137*  BUN 24* 24* 27* 26* 19  CREATININE 1.39* 1.33* 1.06 1.02 0.94  CALCIUM 9.1 8.3* 8.7* 8.8* 8.5*   Liver Function Tests: Recent Labs  Lab 07/01/17 1751  AST 26  ALT 27  ALKPHOS 88  BILITOT 0.7  PROT 7.1  ALBUMIN 3.7   No results for input(s): LIPASE, AMYLASE in the last 168 hours. No results for input(s): AMMONIA in the last 168 hours. CBC: Recent Labs  Lab 07/01/17 1751 07/02/17 0217 07/03/17 0350 07/04/17 0327 07/05/17 0355  WBC 18.8* 21.7* 24.5* 17.2* 11.5*  NEUTROABS  --  20.7*  --   --   --   HGB 15.9 14.2 13.2 13.6 14.2  HCT 46.8 42.5 40.6 41.0 42.4  MCV 94.9 94.7 96.9 94.7 92.6  PLT 447* 411* 400 405* 422*   Cardiac Enzymes: Recent Labs  Lab 07/03/17 0350  TROPONINI <0.03   BNP: Invalid input(s): POCBNP CBG: No results for input(s): GLUCAP in the last 168 hours. D-Dimer No results for input(s): DDIMER in the last 72 hours. Hgb A1c No results for input(s): HGBA1C in the last 72 hours. Lipid Profile No results for input(s): CHOL, HDL, LDLCALC, TRIG, CHOLHDL, LDLDIRECT in the last 72 hours. Thyroid function  studies No results for input(s): TSH, T4TOTAL, T3FREE, THYROIDAB in the last 72 hours.  Invalid input(s): FREET3 Anemia work up No results for input(s): VITAMINB12, FOLATE, FERRITIN, TIBC, IRON, RETICCTPCT in the last 72 hours. Urinalysis No results found for: COLORURINE, APPEARANCEUR, [place], [place], [place], [place], [place], [place], PROTEINUR, UROBILINOGEN, NITRITE, LEUKOCYTESUR Sepsis Labs Invalid input(s): PROCALCITONIN,  WBC,  LACTICIDVEN Microbiology Recent Results (from the past 240 hour(s))  Blood culture (routine x 2)     Status: None (Preliminary result)   Collection Time: 07/01/17  8:26 PM  Result Value Ref Range Status   Specimen Description BLOOD BLOOD LEFT FOREARM  Final  Special Requests   Final    BOTTLES DRAWN AEROBIC AND ANAEROBIC Blood Culture adequate volume   Culture   Final    NO GROWTH 3 DAYS Performed at [facility], [place] [address], [place], [place] [address]    Report Status PENDING  Incomplete  Blood culture (routine x 2)     Status: None (Preliminary result)   Collection Time: 07/01/17 11:55 PM  Result Value Ref Range Status   Specimen Description BLOOD LEFT HAND  Final   Special Requests   Final    BOTTLES DRAWN AEROBIC AND ANAEROBIC Blood Culture adequate volume   Culture   Final    NO GROWTH 2 DAYS Performed at [facility], [place] [address], [place],  [address]    Report Status PENDING  Incomplete     Time coordinating discharge: 34 minutes  SIGNED:   Georgette Shell, MD  [facility] 07/05/2017, 7:46 AM Pager   If 7PM-7AM, please contact night-coverage [email or web address] Password [email or web address]

## 2017-07-05 NOTE — Plan of Care (Signed)
Care plans reviewed,and pt is progressing

## 2017-07-05 NOTE — Care Management Note (Signed)
Case Management Note Marvetta Gibbons RN, BSN Unit 4E-Case Manager [phone]  Patient Details  Name: Joel Mullins MRN: [id number] Date of Birth: 01-Nov-1960  Subjective/Objective:   Pt admitted with Asthma exacerbation                Action/Plan: PTA pt lived at home- independent- no CM needs noted for transition home  Expected Discharge Date:  07/05/17               Expected Discharge Plan:  Home/Self Care  In-House Referral:  NA  Discharge planning Services  CM Consult  Post Acute Care Choice:  NA Choice offered to:  NA  DME Arranged:    DME Agency:     HH Arranged:    [place] Agency:     Status of Service:  Completed, signed off  If discussed at [place] of Stay Meetings, dates discussed:    Discharge Disposition: home/self care   Additional Comments:  Dawayne Patricia, RN 07/05/2017, 10:10 AM

## 2017-07-06 LAB — CULTURE, BLOOD (ROUTINE X 2)
Culture: NO GROWTH
Special Requests: ADEQUATE

## 2017-07-07 LAB — CULTURE, BLOOD (ROUTINE X 2)
Culture: NO GROWTH
SPECIAL REQUESTS: ADEQUATE

## 2017-08-13 ENCOUNTER — Institutional Professional Consult (permissible substitution): Admitting: Pulmonary Disease

## 2017-08-22 ENCOUNTER — Ambulatory Visit: Payer: Self-pay | Admitting: Allergy and Immunology

## 2018-06-28 ENCOUNTER — Telehealth: Payer: Self-pay | Admitting: *Deleted

## 2018-06-28 NOTE — Telephone Encounter (Signed)
REFERRAL SENT TO SCHEDULING AND NOTES ON FILE FROM [facility] AT [facility] [phone].

## 2018-07-03 ENCOUNTER — Telehealth: Payer: Self-pay | Admitting: Cardiology

## 2018-07-03 NOTE — Progress Notes (Signed)
Virtual Visit via Video Note   This visit type was conducted due to national recommendations for restrictions regarding the COVID-19 Pandemic (e.g. social distancing) in an effort to limit this patient's exposure and mitigate transmission in our community.  Due to his co-morbid illnesses, this patient is at least at moderate risk for complications without adequate follow up.  This format is felt to be most appropriate for this patient at this time.  All issues noted in this document were discussed and addressed.  A limited physical exam was performed with this format.  Please refer to the patient's chart for his consent to telehealth for [facility].   Date:  07/04/2018   ID:  Joel Mullins, DOB August 26, 1960, MRN [id number]  Patient Location: Home Provider Location: Home  PCP:  Christa See, FNP  Cardiologist:  Minus Breeding, MD  Electrophysiologist:  None   Evaluation Performed:  New Patient Evaluation  Chief Complaint:  Chest pain.    History of Present Illness:    Joel Mullins is a 58 y.o. male who was referred by Christa See, [place] for evaluation of chest pain and SOB.    I was able to look a primary care office notes.  I was able to look t and notes in [place] from [facility] last year.  I do note that he had an echo here as well.  EF on echo in May of last year demonstrated NSR, mild LVH and no significant abnormalities.  He is not sure why he had an echo.  At [facility] he was hospitalized overnight for chest pain.  He had a nuclear study which suggested an inferolateral mild defect thought to be artifact.  This was thought to be low risk and there was no objective evidence of ischemia.  He was thought to have non-anginal chest pain.  This was coupled with a cardiac catheterization he had at [facility] some years ago probably in 2012 which was normal coronaries by their report.  The patient reports that he gets short of breath.  It happens with mild activity.  He is not  describing PND or orthopnea.  He will get short of breath walking 25 yards on level ground.  This has been slowly progressive.  He is not describing new weight gain or edema though he does have obesity.  He has episodes of feeling weak and shaky.  His skin will feel cold.  He will feel diaphoretic.  This happens sporadically.  When it does happen he does have his blood pressure check and reported that 1 time it was 200/110.  He says his blood pressure typically runs high.  He recently had his Norvasc increased from 2.5 to 5 mg.  He also describes this feeling "like a spongy" feeling in his chest at times.  It is very hard for him to elucidate but it feels like a skipping heartbeat.  He says he wore a Holter years ago.  He does not sound like he is overly active.  With activities he cannot bring on any of the symptoms.  He is not describing new chest pressure, neck or arm discomfort.  The patient does not have symptoms concerning for COVID-19 infection (fever, chills, cough, or new shortness of breath).    Past Medical History:  Diagnosis Date  . Asthma   . Dyslipidemia   . Hypertension   . Sleep apnea    Past Surgical History:  Procedure Laterality Date  . APPENDECTOMY    . HIP FRACTURE  SURGERY Right   . KIDNEY STONE SURGERY    . SHOULDER SURGERY Left      Current Meds  Medication Sig  . amLODipine (NORVASC) 5 MG tablet Take 5 mg by mouth daily.  . diclofenac sodium (VOLTAREN) 1 % GEL Apply 1 g topically 4 (four) times daily.  . hydrochlorothiazide (HYDRODIURIL) 25 MG tablet Take 25 mg by mouth daily.  . pantoprazole (PROTONIX) 40 MG tablet Take 1 tablet (40 mg total) by mouth 2 (two) times daily before a meal.  . sildenafil (VIAGRA) 100 MG tablet Take 100 mg by mouth daily as needed for erectile dysfunction.  . [DISCONTINUED] amLODipine (NORVASC) 2.5 MG tablet Take 1 tablet (2.5 mg total) by mouth daily.     Allergies:   Shellfish allergy   Social History   Tobacco Use  .  Smoking status: Former Research scientist (life sciences)  . Smokeless tobacco: Never Used  Substance Use Topics  . Alcohol use: No  . Drug use: No     Family Hx: The patient's family history includes Hypertension in his mother; Stroke in his father.  ROS:   Please see the history of present illness.    As stated in the HPI and negative for all other systems.   Prior CV studies:   The following studies were reviewed today:    Labs/Other Tests and Data Reviewed:    EKG:  EKG pending my review  Recent Labs: 07/05/2017: BUN 19; Creatinine, Ser 0.94; Hemoglobin 14.2; Platelets 422; Potassium 4.6; Sodium 138   Recent Lipid Panel No results found for: CHOL, TRIG, HDL, CHOLHDL, LDLCALC, LDLDIRECT  Wt Readings from Last 3 Encounters:  07/04/18 282 lb (127.9 kg)  07/02/17 294 lb 11.2 oz (133.7 kg)  12/24/16 264 lb 1.6 oz (119.8 kg)     Objective:    Vital Signs:  BP (!) 178/88   Pulse 90   Ht 5\' 11"  (1.803 m)   Wt 282 lb (127.9 kg)   BMI 39.33 kg/m    VITAL SIGNS:  reviewed  ASSESSMENT & PLAN:    CHEST PAIN: This is very atypical.  He has had extensive work-up.  No change in therapy.  DOE: I suspect this is related to weight and deconditioning but I am can order pulmonary function testing on him.  We talked about losing weight and specific instructions and hopefully with weight loss and increased physical activity this will improve.  I will also check a BNP level.  PALPITATIONS: He has a vague description of this.  I am going to have him wear a ZIO patch.  COVID-19 Education: The signs and symptoms of COVID-19 were discussed with the patient and how to seek care for testing (follow up with PCP or arrange E-visit).  The importance of social distancing was discussed today.  Time:   Today, I have spent 25 minutes with the patient with telehealth technology discussing the above problems.     Medication Adjustments/Labs and Tests Ordered: Current medicines are reviewed at length with the patient  today.  Concerns regarding medicines are outlined above.   Tests Ordered: No orders of the defined types were placed in this encounter.   Medication Changes: No orders of the defined types were placed in this encounter.   Disposition:  Follow up with me one month in the office.  Signed, Minus Breeding, MD  07/04/2018 12:12 PM    [place]

## 2018-07-04 ENCOUNTER — Encounter: Payer: Self-pay | Admitting: Cardiology

## 2018-07-04 ENCOUNTER — Telehealth: Payer: Self-pay | Admitting: Radiology

## 2018-07-04 ENCOUNTER — Telehealth (INDEPENDENT_AMBULATORY_CARE_PROVIDER_SITE_OTHER): Payer: Managed Care, Other (non HMO) | Admitting: Cardiology

## 2018-07-04 VITALS — BP 178/88 | HR 90 | Ht 71.0 in | Wt 282.0 lb

## 2018-07-04 DIAGNOSIS — R002 Palpitations: Secondary | ICD-10-CM

## 2018-07-04 DIAGNOSIS — R0602 Shortness of breath: Secondary | ICD-10-CM | POA: Diagnosis not present

## 2018-07-04 NOTE — Patient Instructions (Signed)
Medication Instructions:  Continue current medications  If you need a refill on your cardiac medications before your next appointment, please call your pharmacy.  Labwork: BNP HERE IN OUR OFFICE AT [facility]   You will NOT need to fast   Take the provided lab slips with you to the lab for your blood draw.   When you have your labs (blood work) drawn today and your tests are completely normal, you will receive your results only by MyChart Message (if you have MyChart) -OR-  A paper copy in the mail.  If you have any lab test that is abnormal or we need to change your treatment, we will call you to review these results.  Testing/Procedures: Your physician has recommended that you wear a Zio Patch monitor 2 weeks. Zio Patch monitors are medical devices that record the heart's electrical activity. Doctors most often use these monitors to diagnose arrhythmias. Arrhythmias are problems with the speed or rhythm of the heartbeat. The monitor is a small, portable device. You can wear one while you do your normal daily activities. This is usually used to diagnose what is causing palpitations/syncope (passing out).  Your physician has recommended that you have a pulmonary function test. Pulmonary Function Tests are a group of tests that measure how well air moves in and out of your lungs.   Follow-Up: . Your physician recommends that you schedule a follow-up appointment in: 1 Month   At [facility], you and your health needs are our priority.  As part of our continuing mission to provide you with exceptional heart care, we have created designated Provider Care Teams.  These Care Teams include your primary Cardiologist (physician) and Advanced Practice Providers (APPs -  Physician Assistants and Nurse Practitioners) who all work together to provide you with the care you need, when you need it.  Thank you for choosing [facility] at [facility]!!

## 2018-07-04 NOTE — Telephone Encounter (Signed)
Pt had televisit with Dr Percival Spanish today

## 2018-07-04 NOTE — Telephone Encounter (Signed)
Follow Up:   Pt is calling you back.

## 2018-07-04 NOTE — Telephone Encounter (Signed)
Enrolled patient for a 14 day Zio monitor to be mailed. Patient should receive monitor in 3-4 days.

## 2018-07-05 ENCOUNTER — Telehealth: Payer: Self-pay | Admitting: Cardiology

## 2018-07-05 ENCOUNTER — Other Ambulatory Visit: Payer: Self-pay

## 2018-07-05 NOTE — Telephone Encounter (Signed)
New message   Per Baxter Flattery patient has order  for PST test the patient will need to have a covid-19 test before this test is done. Please call to discuss.

## 2018-07-05 NOTE — Telephone Encounter (Signed)
Order placed. Routing back to primary nurse to schedule COVID testing date at appropriate time. Per Baxter Flattery for Joel Mullins, PFT will not be scheduled until around second week of June.

## 2018-07-21 ENCOUNTER — Ambulatory Visit (INDEPENDENT_AMBULATORY_CARE_PROVIDER_SITE_OTHER): Payer: Managed Care, Other (non HMO)

## 2018-07-21 DIAGNOSIS — R002 Palpitations: Secondary | ICD-10-CM | POA: Diagnosis not present

## 2018-07-25 ENCOUNTER — Other Ambulatory Visit: Payer: Self-pay | Admitting: *Deleted

## 2018-07-25 NOTE — Progress Notes (Signed)
Error

## 2018-07-26 NOTE — Telephone Encounter (Signed)
Call and spoke with pt Covid19 test schedule for 06/10 @ 3:40 and pt PFT is schedule for 06/15 @ 9:00 pt is aware of instruction for both Covid test and PFT, pt voice understanding and thanks.

## 2018-07-31 ENCOUNTER — Inpatient Hospital Stay (HOSPITAL_COMMUNITY): Admission: RE | Admit: 2018-07-31 | Source: Ambulatory Visit

## 2018-08-01 ENCOUNTER — Other Ambulatory Visit (HOSPITAL_COMMUNITY)

## 2018-08-05 ENCOUNTER — Inpatient Hospital Stay (HOSPITAL_COMMUNITY): Admission: RE | Admit: 2018-08-05 | Source: Ambulatory Visit

## 2018-08-08 ENCOUNTER — Other Ambulatory Visit: Payer: Self-pay

## 2019-06-27 ENCOUNTER — Other Ambulatory Visit: Payer: Self-pay

## 2019-06-27 ENCOUNTER — Emergency Department (HOSPITAL_BASED_OUTPATIENT_CLINIC_OR_DEPARTMENT_OTHER)
Admission: EM | Admit: 2019-06-27 | Discharge: 2019-06-27 | Disposition: A | Discharge status: Home / Self Care | Attending: Emergency Medicine | Admitting: Emergency Medicine

## 2019-06-27 ENCOUNTER — Encounter (HOSPITAL_BASED_OUTPATIENT_CLINIC_OR_DEPARTMENT_OTHER): Payer: Self-pay

## 2019-06-27 DIAGNOSIS — N289 Disorder of kidney and ureter, unspecified: Secondary | ICD-10-CM | POA: Insufficient documentation

## 2019-06-27 DIAGNOSIS — I10 Essential (primary) hypertension: Secondary | ICD-10-CM | POA: Diagnosis not present

## 2019-06-27 DIAGNOSIS — M545 Low back pain, unspecified: Secondary | ICD-10-CM

## 2019-06-27 DIAGNOSIS — Z79899 Other long term (current) drug therapy: Secondary | ICD-10-CM | POA: Diagnosis not present

## 2019-06-27 DIAGNOSIS — Z87891 Personal history of nicotine dependence: Secondary | ICD-10-CM | POA: Diagnosis not present

## 2019-06-27 DIAGNOSIS — M5489 Other dorsalgia: Secondary | ICD-10-CM | POA: Diagnosis present

## 2019-06-27 DIAGNOSIS — J45909 Unspecified asthma, uncomplicated: Secondary | ICD-10-CM | POA: Diagnosis not present

## 2019-06-27 MED ORDER — METHOCARBAMOL 500 MG PO TABS: 500.0000 mg | ORAL_TABLET | Freq: Two times a day (BID) | ORAL | 0 refills | Status: AC

## 2019-06-27 NOTE — ED Triage Notes (Signed)
Pt c/o lower back pain x 3-4 days-denies injury-slow gait with walking stick
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
## 2019-06-27 NOTE — ED Notes (Signed)
ED Provider at bedside. 

## 2019-06-27 NOTE — Discharge Instructions (Signed)
Please take Tylenol and ibuprofen as discussed.  Please use Robaxin as well.  Please follow-up with Dr. Clearance Coots sports medicine here at [facility].  Please do the back exercises that we discussed and that I have printed for you.  Gentle stretching, warm salt water/Epson salt soaks will also be helpful.  You may also follow-up with a physical therapist as this will likely benefit you long-term. Please return to the ED for any new or concerning symptoms such as the ones we discussed specifically including urinating or pooping without meaning to, numbness between your legs, high fever.

## 2019-10-06 IMAGING — DX DG CHEST 1V
1 series · 1 of 1 positions shown · non-contrast
Comparison: CT 09/08/2008

CLINICAL DATA: 56-year-old male with chest pain, shortness of
breath and diaphoresis.

EXAM:
CHEST 1 VIEW

[chest ap]
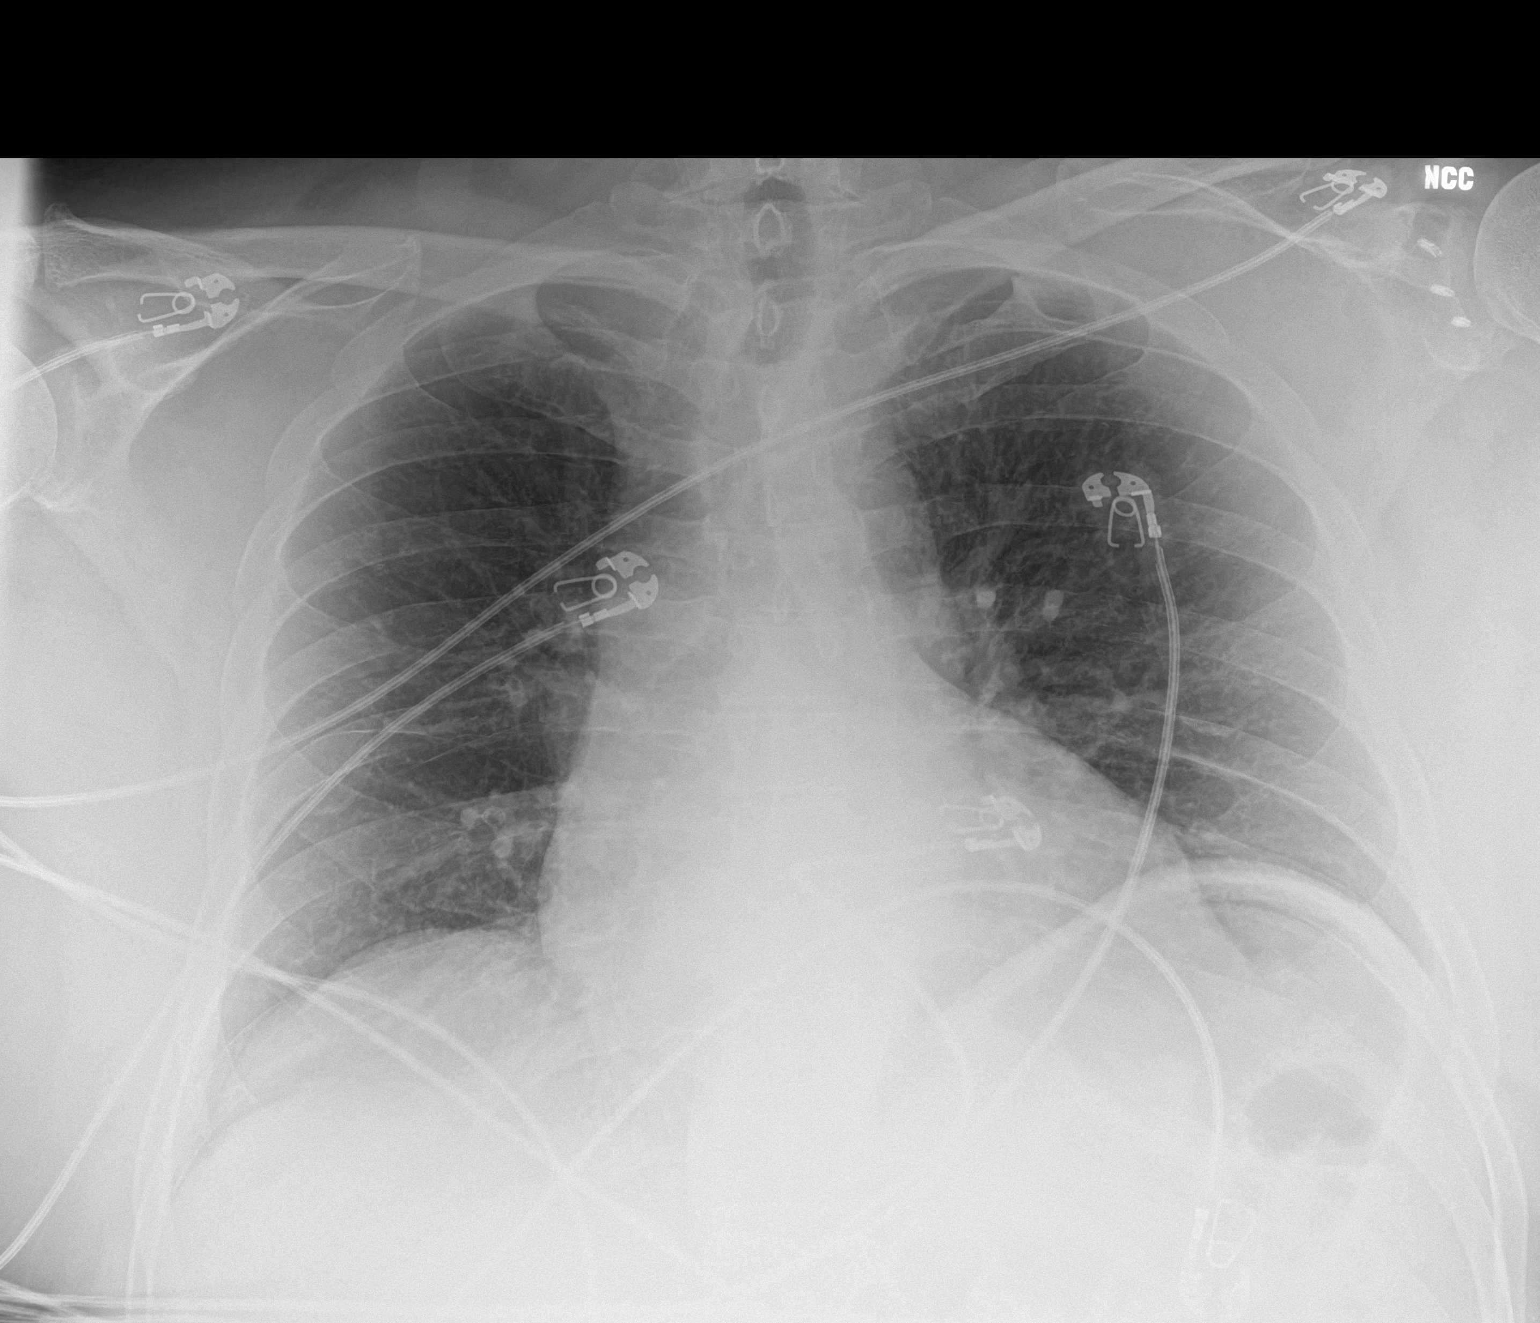

[1 of 1 positions shown; findings below may reference images not displayed]

FINDINGS: The heart size and mediastinal contours are within normal limits.
Note is made of low lung volumes. There is stable elevation of the
left hemidiaphragm. Both lungs are otherwise clear. The visualized
skeletal structures are unremarkable.
IMPRESSION: Low lung volumes without acute cardiopulmonary pathology.

## 2019-10-07 IMAGING — CR DG CHEST 2V
2 series · 2 of 2 positions shown · non-contrast
Comparison: 12/23/2016

CLINICAL DATA: 56-year-old male with cough today.

EXAM:
CHEST  2 VIEW

[chest pa]
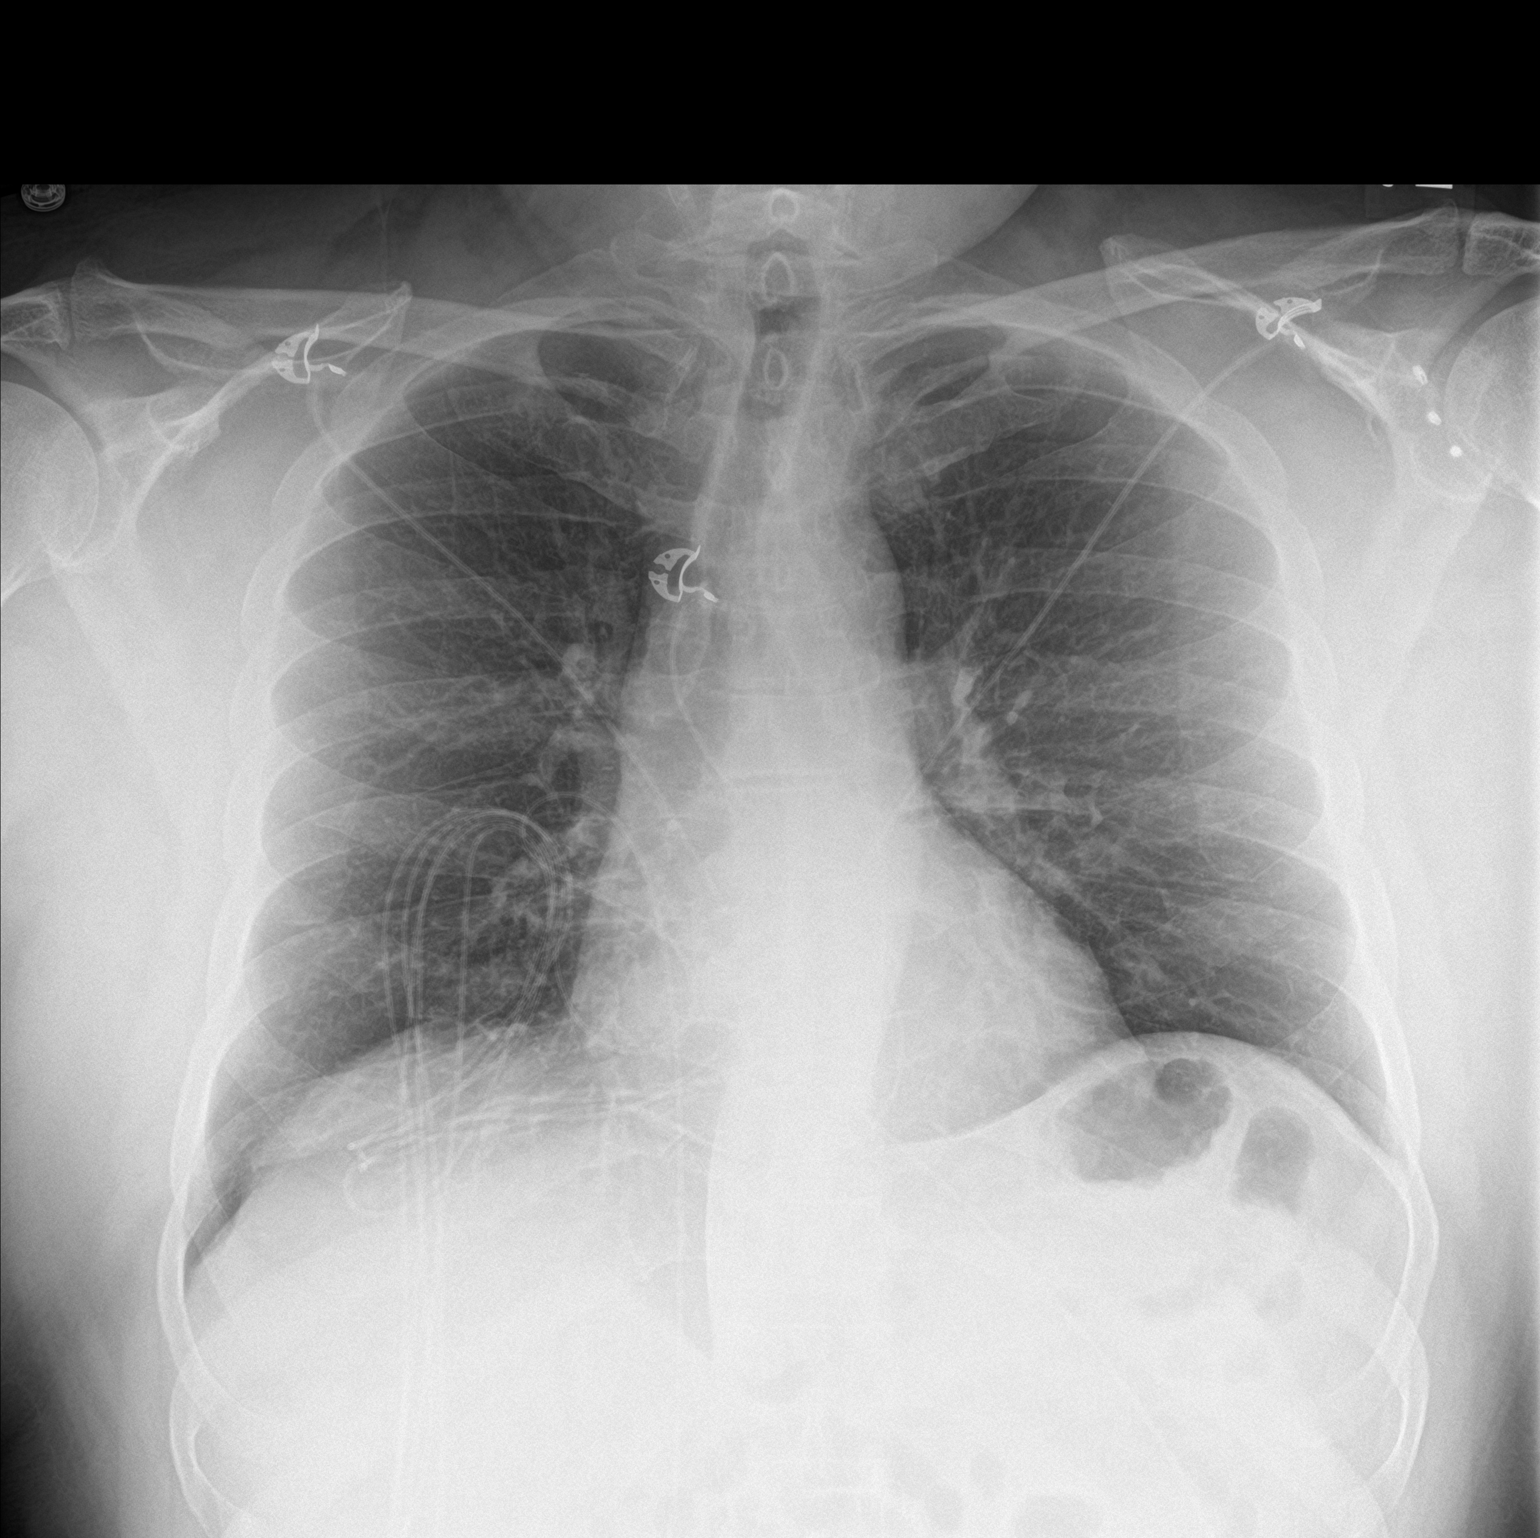

[chest lat]
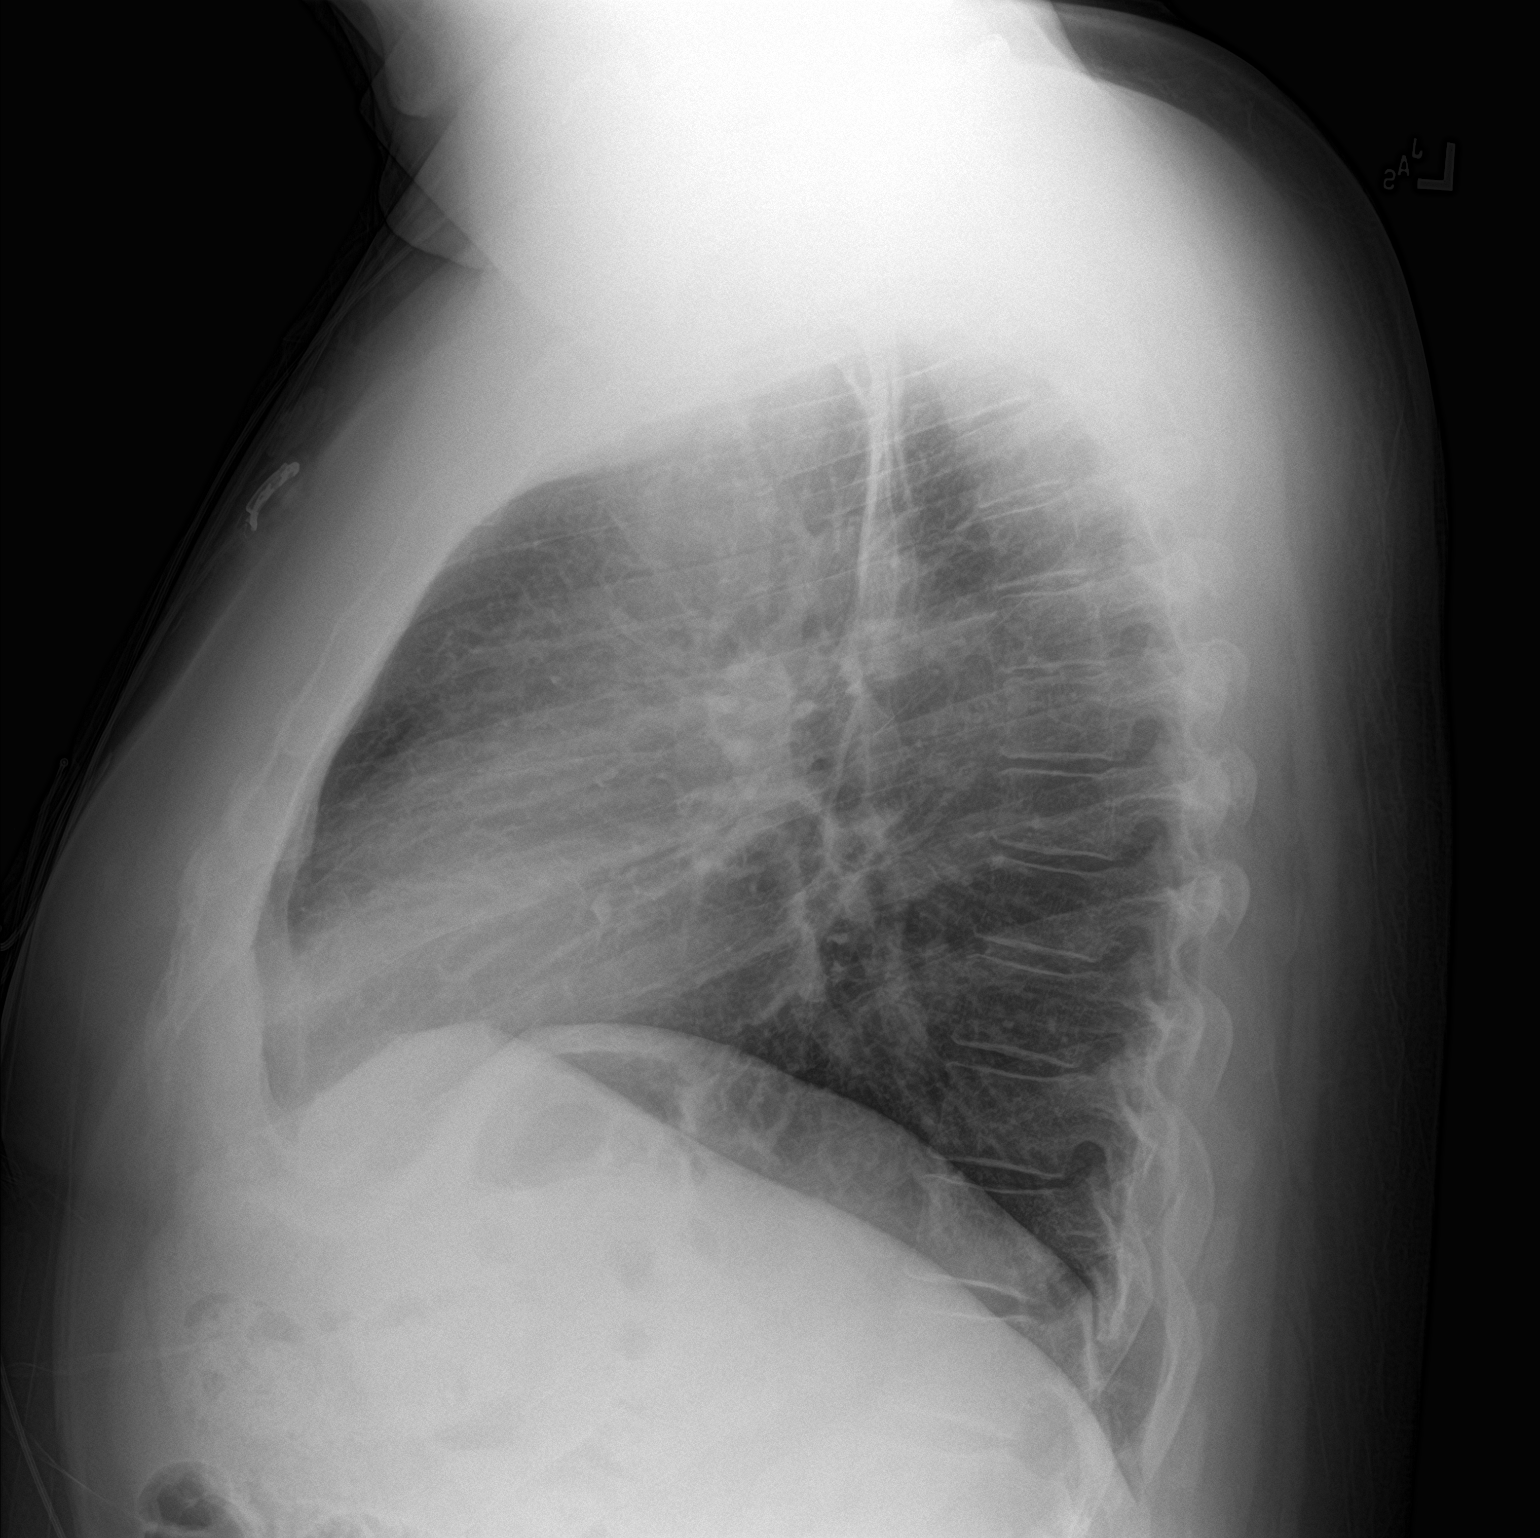

[2 of 2 positions shown; findings below may reference images not displayed]

FINDINGS: The heart size and mediastinal contours are unchanged. There is mild
elevation of the left hemidiaphragm. Both lungs are clear. No
pleural effusions or pneumothorax. The visualized skeletal
structures are unremarkable.
IMPRESSION: No active cardiopulmonary disease.

## 2020-04-07 ENCOUNTER — Encounter (HOSPITAL_COMMUNITY): Payer: Self-pay

## 2020-04-07 ENCOUNTER — Other Ambulatory Visit: Payer: Self-pay

## 2020-04-07 ENCOUNTER — Emergency Department (HOSPITAL_COMMUNITY)
Admission: EM | Admit: 2020-04-07 | Discharge: 2020-04-07 | Discharge status: Against Medical Advice | Attending: Emergency Medicine | Admitting: Emergency Medicine

## 2020-04-07 ENCOUNTER — Emergency Department (HOSPITAL_COMMUNITY)

## 2020-04-07 DIAGNOSIS — M542 Cervicalgia: Secondary | ICD-10-CM | POA: Insufficient documentation

## 2020-04-07 DIAGNOSIS — M25512 Pain in left shoulder: Secondary | ICD-10-CM | POA: Insufficient documentation

## 2020-04-07 DIAGNOSIS — Z5321 Procedure and treatment not carried out due to patient leaving prior to being seen by health care provider: Secondary | ICD-10-CM | POA: Diagnosis not present

## 2020-04-07 NOTE — ED Notes (Signed)
As sort called pt back to room pt was walking out. Informed pt that they had a room and pt continued to walk out the door and ignore information.

## 2020-04-07 NOTE — ED Triage Notes (Signed)
Pt c/o neck & shoulder pain x5days. Was seen at [facility] for same, told it is a rotator cuff injury, that he has tears in muscle surrounding joint. Was given numbing injection & cortisone shot which helped shoulder but not neck. States pain is back, 10/10

## 2020-06-21 ENCOUNTER — Other Ambulatory Visit: Payer: Self-pay | Admitting: Sports Medicine

## 2020-06-21 DIAGNOSIS — M25572 Pain in left ankle and joints of left foot: Secondary | ICD-10-CM

## 2020-06-27 ENCOUNTER — Ambulatory Visit
Admission: RE | Admit: 2020-06-27 | Discharge: 2020-06-27 | Disposition: A | Discharge status: Home / Self Care | Source: Ambulatory Visit | Attending: Sports Medicine | Admitting: Sports Medicine

## 2020-06-27 ENCOUNTER — Other Ambulatory Visit: Payer: Self-pay

## 2020-06-27 DIAGNOSIS — M25572 Pain in left ankle and joints of left foot: Secondary | ICD-10-CM

## 2020-10-11 ENCOUNTER — Encounter (HOSPITAL_COMMUNITY): Payer: Self-pay

## 2020-10-11 ENCOUNTER — Emergency Department (HOSPITAL_COMMUNITY)
Admission: EM | Admit: 2020-10-11 | Discharge: 2020-10-11 | Disposition: A | Discharge status: Home / Self Care | Attending: Emergency Medicine | Admitting: Emergency Medicine

## 2020-10-11 ENCOUNTER — Emergency Department (HOSPITAL_COMMUNITY)

## 2020-10-11 DIAGNOSIS — I10 Essential (primary) hypertension: Secondary | ICD-10-CM | POA: Insufficient documentation

## 2020-10-11 DIAGNOSIS — M542 Cervicalgia: Secondary | ICD-10-CM | POA: Insufficient documentation

## 2020-10-11 DIAGNOSIS — R0789 Other chest pain: Secondary | ICD-10-CM | POA: Diagnosis not present

## 2020-10-11 DIAGNOSIS — Z87891 Personal history of nicotine dependence: Secondary | ICD-10-CM | POA: Diagnosis not present

## 2020-10-11 DIAGNOSIS — R202 Paresthesia of skin: Secondary | ICD-10-CM | POA: Diagnosis not present

## 2020-10-11 DIAGNOSIS — R109 Unspecified abdominal pain: Secondary | ICD-10-CM | POA: Insufficient documentation

## 2020-10-11 DIAGNOSIS — R0602 Shortness of breath: Secondary | ICD-10-CM | POA: Insufficient documentation

## 2020-10-11 DIAGNOSIS — Z20822 Contact with and (suspected) exposure to covid-19: Secondary | ICD-10-CM | POA: Diagnosis not present

## 2020-10-11 DIAGNOSIS — M25572 Pain in left ankle and joints of left foot: Secondary | ICD-10-CM | POA: Diagnosis not present

## 2020-10-11 DIAGNOSIS — Y92410 Unspecified street and highway as the place of occurrence of the external cause: Secondary | ICD-10-CM | POA: Diagnosis not present

## 2020-10-11 DIAGNOSIS — Z79899 Other long term (current) drug therapy: Secondary | ICD-10-CM | POA: Insufficient documentation

## 2020-10-11 DIAGNOSIS — M25511 Pain in right shoulder: Secondary | ICD-10-CM | POA: Diagnosis not present

## 2020-10-11 DIAGNOSIS — M25512 Pain in left shoulder: Secondary | ICD-10-CM

## 2020-10-11 DIAGNOSIS — R410 Disorientation, unspecified: Secondary | ICD-10-CM | POA: Diagnosis not present

## 2020-10-11 DIAGNOSIS — T1490XA Injury, unspecified, initial encounter: Secondary | ICD-10-CM

## 2020-10-11 HISTORY — DX: Essential (primary) hypertension: I10

## 2020-10-11 HISTORY — DX: Depression, unspecified: F32.A

## 2020-10-11 LAB — CBC
HCT: 44.6 % (ref 39.0–52.0)
Hemoglobin: 15 g/dL (ref 13.0–17.0)
MCH: 31.6 pg (ref 26.0–34.0)
MCHC: 33.6 g/dL (ref 30.0–36.0)
MCV: 94.1 fL (ref 80.0–100.0)
Platelets: 327 10*3/uL (ref 150–400)
RBC: 4.74 MIL/uL (ref 4.22–5.81)
RDW: 14 % (ref 11.5–15.5)
WBC: 9.5 10*3/uL (ref 4.0–10.5)
nRBC: 0 % (ref 0.0–0.2)

## 2020-10-11 LAB — COMPREHENSIVE METABOLIC PANEL
ALT: 21 U/L (ref 0–44)
AST: 23 U/L (ref 15–41)
Albumin: 3.7 g/dL (ref 3.5–5.0)
Alkaline Phosphatase: 77 U/L (ref 38–126)
Anion gap: 8 (ref 5–15)
BUN: 15 mg/dL (ref 6–20)
CO2: 23 mmol/L (ref 22–32)
Calcium: 9.2 mg/dL (ref 8.9–10.3)
Chloride: 106 mmol/L (ref 98–111)
Creatinine, Ser: 1.08 mg/dL (ref 0.61–1.24)
GFR, Estimated: >60 mL/min (ref >60)
Glucose, Bld: 115 mg/dL — ABNORMAL HIGH (ref 70–99)
Potassium: 4.6 mmol/L (ref 3.5–5.1)
Sodium: 137 mmol/L (ref 135–145)
Total Bilirubin: 0.8 mg/dL (ref 0.3–1.2)
Total Protein: 6.8 g/dL (ref 6.5–8.1)

## 2020-10-11 LAB — ETHANOL: Alcohol, Ethyl (B): <10 mg/dL (ref <10)

## 2020-10-11 LAB — I-STAT CHEM 8, ED
BUN: 22 mg/dL — ABNORMAL HIGH (ref 6–20)
Calcium, Ion: 1.14 mmol/L — ABNORMAL LOW (ref 1.15–1.40)
Chloride: 105 mmol/L (ref 98–111)
Creatinine, Ser: 1 mg/dL (ref 0.61–1.24)
Glucose, Bld: 115 mg/dL — ABNORMAL HIGH (ref 70–99)
HCT: 45 % (ref 39.0–52.0)
Hemoglobin: 15.3 g/dL (ref 13.0–17.0)
Potassium: 5.8 mmol/L — ABNORMAL HIGH (ref 3.5–5.1)
Sodium: 138 mmol/L (ref 135–145)
TCO2: 27 mmol/L (ref 22–32)

## 2020-10-11 LAB — RESP PANEL BY RT-PCR (FLU A&B, COVID) ARPGX2
Influenza A by PCR: NEGATIVE
Influenza B by PCR: NEGATIVE
SARS Coronavirus 2 by RT PCR: NEGATIVE

## 2020-10-11 LAB — PROTIME-INR
INR: 1.1 (ref 0.8–1.2)
Prothrombin Time: 13.8 seconds (ref 11.4–15.2)

## 2020-10-11 LAB — LACTIC ACID, PLASMA: Lactic Acid, Venous: 1.8 mmol/L (ref 0.5–1.9)

## 2020-10-11 MED ORDER — IOHEXOL 350 MG/ML SOLN
100.0000 mL | Freq: Once | INTRAVENOUS | Status: AC | PRN
  Administered 2020-10-11: 100 mL via INTRAVENOUS

## 2020-10-11 MED ORDER — OXYCODONE HCL 5 MG PO TABS: 10.0000 mg | ORAL_TABLET | Freq: Once | ORAL | Status: DC

## 2020-10-11 MED ORDER — HYDROMORPHONE HCL 1 MG/ML IJ SOLN
1.0000 mg | Freq: Once | INTRAMUSCULAR | Status: AC
  Administered 2020-10-11: 1 mg via INTRAVENOUS
  Filled 2020-10-11: qty 1

## 2020-10-11 MED ORDER — OXYCODONE HCL 5 MG PO TABS
5.0000 mg | ORAL_TABLET | Freq: Once | ORAL | Status: AC
  Administered 2020-10-11: 5 mg via ORAL
  Filled 2020-10-11: qty 1

## 2020-10-11 MED ORDER — ONDANSETRON HCL 4 MG/2ML IJ SOLN
4.0000 mg | Freq: Once | INTRAMUSCULAR | Status: AC
  Administered 2020-10-11: 4 mg via INTRAVENOUS
  Filled 2020-10-11: qty 2
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
## 2020-10-11 NOTE — ED Notes (Signed)
Patient transported to CT 
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
## 2020-10-12 ENCOUNTER — Encounter (HOSPITAL_COMMUNITY): Payer: Self-pay

## 2020-10-12 LAB — SAMPLE TO BLOOD BANK

## 2021-12-20 ENCOUNTER — Emergency Department (HOSPITAL_COMMUNITY): Payer: No Typology Code available for payment source

## 2021-12-20 ENCOUNTER — Other Ambulatory Visit: Payer: Self-pay

## 2021-12-20 ENCOUNTER — Encounter (HOSPITAL_COMMUNITY): Payer: Self-pay

## 2021-12-20 ENCOUNTER — Emergency Department (HOSPITAL_COMMUNITY)
Admission: EM | Admit: 2021-12-20 | Discharge: 2021-12-20 | Discharge status: Against Medical Advice | Payer: No Typology Code available for payment source | Attending: Student | Admitting: Student

## 2021-12-20 DIAGNOSIS — R509 Fever, unspecified: Secondary | ICD-10-CM | POA: Insufficient documentation

## 2021-12-20 DIAGNOSIS — R0989 Other specified symptoms and signs involving the circulatory and respiratory systems: Secondary | ICD-10-CM | POA: Insufficient documentation

## 2021-12-20 DIAGNOSIS — R0981 Nasal congestion: Secondary | ICD-10-CM | POA: Diagnosis not present

## 2021-12-20 DIAGNOSIS — R519 Headache, unspecified: Secondary | ICD-10-CM | POA: Diagnosis not present

## 2021-12-20 DIAGNOSIS — R0602 Shortness of breath: Secondary | ICD-10-CM | POA: Insufficient documentation

## 2021-12-20 DIAGNOSIS — M549 Dorsalgia, unspecified: Secondary | ICD-10-CM | POA: Diagnosis not present

## 2021-12-20 DIAGNOSIS — M542 Cervicalgia: Secondary | ICD-10-CM | POA: Insufficient documentation

## 2021-12-20 DIAGNOSIS — J45909 Unspecified asthma, uncomplicated: Secondary | ICD-10-CM | POA: Diagnosis not present

## 2021-12-20 DIAGNOSIS — Z5321 Procedure and treatment not carried out due to patient leaving prior to being seen by health care provider: Secondary | ICD-10-CM | POA: Diagnosis not present

## 2021-12-20 LAB — CBC WITH DIFFERENTIAL/PLATELET
Abs Immature Granulocytes: 0.05 10*3/uL (ref 0.00–0.07)
Basophils Absolute: 0.1 10*3/uL (ref 0.0–0.1)
Basophils Relative: 1 %
Eosinophils Absolute: 0.2 10*3/uL (ref 0.0–0.5)
Eosinophils Relative: 2 %
HCT: 43.3 % (ref 39.0–52.0)
Hemoglobin: 14.4 g/dL (ref 13.0–17.0)
Immature Granulocytes: 0 %
Lymphocytes Relative: 28 %
Lymphs Abs: 3.4 10*3/uL (ref 0.7–4.0)
MCH: 32.2 pg (ref 26.0–34.0)
MCHC: 33.3 g/dL (ref 30.0–36.0)
MCV: 96.9 fL (ref 80.0–100.0)
Monocytes Absolute: 1.4 10*3/uL — ABNORMAL HIGH (ref 0.1–1.0)
Monocytes Relative: 12 %
Neutro Abs: 6.7 10*3/uL (ref 1.7–7.7)
Neutrophils Relative %: 57 %
Platelets: 279 10*3/uL (ref 150–400)
RBC: 4.47 MIL/uL (ref 4.22–5.81)
RDW: 14.3 % (ref 11.5–15.5)
WBC: 11.9 10*3/uL — ABNORMAL HIGH (ref 4.0–10.5)
nRBC: 0 % (ref 0.0–0.2)

## 2021-12-20 LAB — BASIC METABOLIC PANEL
Anion gap: 6 (ref 5–15)
BUN: 24 mg/dL — ABNORMAL HIGH (ref 8–23)
CO2: 26 mmol/L (ref 22–32)
Calcium: 9.1 mg/dL (ref 8.9–10.3)
Chloride: 106 mmol/L (ref 98–111)
Creatinine, Ser: 1.34 mg/dL — ABNORMAL HIGH (ref 0.61–1.24)
GFR, Estimated: >60 mL/min (ref >60)
Glucose, Bld: 92 mg/dL (ref 70–99)
Potassium: 4.4 mmol/L (ref 3.5–5.1)
Sodium: 138 mmol/L (ref 135–145)

## 2021-12-20 NOTE — ED Provider Triage Note (Signed)
Emergency Medicine Provider Triage Evaluation Note  Joel Mullins , a 61 y.o. male  was evaluated in triage.  Pt complains of fever for the past 2 days.  Also endorsing some chest congestion, scratchy throat and shortness of breath.  No known sick contacts.  History of asthma.  Wife requested that he come here.  Review of Systems  Positive:  Negative:   Physical Exam  BP (!) 152/86   Pulse 75   Temp 98.3 F (36.8 C)   Resp 18   SpO2 93%  Gen:   Awake, no distress   Resp:  Normal effort  MSK:   Moves extremities without difficulty  Other:  Fine crackles in bilateral lower lung fields, RRR.  Sounds congested.  Medical Decision Making  Medically screening exam initiated at 1:20 PM.  Appropriate orders placed.  Joel Mullins was informed that the remainder of the evaluation will be completed by another provider, this initial triage assessment does not replace that evaluation, and the importance of remaining in the ED until their evaluation is complete.     Joel Hammock, PA-C 12/20/21 1320

## 2021-12-20 NOTE — ED Notes (Signed)
Pt sts he is going home, unable to wait any longer to be seen

## 2021-12-20 NOTE — ED Triage Notes (Signed)
Patient said he woke up Sunday morning with a fever of 102F. Has a headache, back pain, neck pain, and feels like there is congestion in his chest. Today he had a fever before arrival of 103F at home, took naproxen.

## 2022-05-25 ENCOUNTER — Other Ambulatory Visit (HOSPITAL_COMMUNITY): Payer: Self-pay | Admitting: Internal Medicine

## 2022-05-25 DIAGNOSIS — R509 Fever, unspecified: Secondary | ICD-10-CM

## 2022-06-14 ENCOUNTER — Ambulatory Visit (HOSPITAL_COMMUNITY)
Admission: RE | Admit: 2022-06-14 | Discharge: 2022-06-14 | Disposition: A | Discharge status: Home / Self Care | Payer: No Typology Code available for payment source | Source: Ambulatory Visit | Attending: Internal Medicine | Admitting: Internal Medicine

## 2022-06-14 DIAGNOSIS — R509 Fever, unspecified: Secondary | ICD-10-CM | POA: Diagnosis not present

## 2022-06-14 MED ORDER — SODIUM CHLORIDE (PF) 0.9 % IJ SOLN
INTRAMUSCULAR | Status: AC
  Filled 2022-06-14: qty 50

## 2022-06-14 MED ORDER — IOHEXOL 300 MG/ML  SOLN
100.0000 mL | Freq: Once | INTRAMUSCULAR | Status: AC | PRN
  Administered 2022-06-14: 100 mL via INTRAVENOUS

## 2022-06-14 MED ORDER — DIPHENHYDRAMINE HCL 50 MG/ML IJ SOLN
50.0000 mg | Freq: Once | INTRAMUSCULAR | Status: AC
  Administered 2022-06-14: 50 mg via INTRAVENOUS

## 2022-06-14 MED ORDER — DIPHENHYDRAMINE HCL 50 MG/ML IJ SOLN
INTRAMUSCULAR | Status: AC
  Filled 2022-06-14: qty 1

## 2023-11-21 DEATH — deceased
